# Patient Record
Sex: Female | Born: 1985 | Race: Black or African American | Hispanic: No | Smoking: Former smoker
Health system: Southern US, Community
[De-identification: ages and names within clinical notes are randomized; demographics above are authoritative.]

## PROBLEM LIST (undated history)

## (undated) ENCOUNTER — Inpatient Hospital Stay (HOSPITAL_COMMUNITY): Payer: Self-pay

## (undated) DIAGNOSIS — O1490 Unspecified pre-eclampsia, unspecified trimester: Secondary | ICD-10-CM

## (undated) DIAGNOSIS — R51 Headache: Secondary | ICD-10-CM

## (undated) DIAGNOSIS — N83209 Unspecified ovarian cyst, unspecified side: Secondary | ICD-10-CM

## (undated) DIAGNOSIS — B999 Unspecified infectious disease: Secondary | ICD-10-CM

## (undated) DIAGNOSIS — O139 Gestational [pregnancy-induced] hypertension without significant proteinuria, unspecified trimester: Secondary | ICD-10-CM

## (undated) HISTORY — PX: WISDOM TOOTH EXTRACTION: SHX21

## (undated) HISTORY — DX: Unspecified pre-eclampsia, unspecified trimester: O14.90

## (undated) HISTORY — PX: THERAPEUTIC ABORTION: SHX798

---
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
## 2006-02-28 ENCOUNTER — Emergency Department (HOSPITAL_COMMUNITY): Admission: EM | Admit: 2006-02-28 | Discharge: 2006-02-28 | Payer: Self-pay | Admitting: Family Medicine

## 2006-10-07 ENCOUNTER — Emergency Department (HOSPITAL_COMMUNITY): Admission: EM | Admit: 2006-10-07 | Discharge: 2006-10-07 | Payer: Self-pay | Admitting: Family Medicine

## 2006-11-10 ENCOUNTER — Emergency Department (HOSPITAL_COMMUNITY): Admission: EM | Admit: 2006-11-10 | Discharge: 2006-11-10 | Payer: Self-pay | Admitting: Family Medicine

## 2008-01-11 ENCOUNTER — Emergency Department (HOSPITAL_COMMUNITY): Admission: EM | Admit: 2008-01-11 | Discharge: 2008-01-11 | Payer: Self-pay | Admitting: Family Medicine

## 2008-02-05 ENCOUNTER — Emergency Department (HOSPITAL_COMMUNITY): Admission: EM | Admit: 2008-02-05 | Discharge: 2008-02-05 | Payer: Self-pay | Admitting: Family Medicine

## 2008-02-25 ENCOUNTER — Inpatient Hospital Stay (HOSPITAL_COMMUNITY): Admission: AD | Admit: 2008-02-25 | Discharge: 2008-02-25 | Payer: Self-pay | Admitting: Obstetrics & Gynecology

## 2008-06-26 ENCOUNTER — Ambulatory Visit (HOSPITAL_COMMUNITY): Admission: RE | Admit: 2008-06-26 | Discharge: 2008-06-26 | Payer: Self-pay | Admitting: Obstetrics & Gynecology

## 2008-09-17 ENCOUNTER — Inpatient Hospital Stay (HOSPITAL_COMMUNITY): Admission: AD | Admit: 2008-09-17 | Discharge: 2008-09-17 | Payer: Self-pay | Admitting: Obstetrics

## 2008-10-08 ENCOUNTER — Inpatient Hospital Stay (HOSPITAL_COMMUNITY): Admission: AD | Admit: 2008-10-08 | Discharge: 2008-10-11 | Payer: Self-pay | Admitting: Obstetrics & Gynecology

## 2009-12-24 ENCOUNTER — Emergency Department (HOSPITAL_COMMUNITY): Admission: EM | Admit: 2009-12-24 | Discharge: 2009-12-24 | Payer: Self-pay | Admitting: Emergency Medicine

## 2010-07-11 LAB — URINALYSIS, ROUTINE W REFLEX MICROSCOPIC
Bilirubin Urine: NEGATIVE
Glucose, UA: NEGATIVE mg/dL
Hgb urine dipstick: NEGATIVE
Nitrite: POSITIVE — AB
Protein, ur: NEGATIVE mg/dL
Specific Gravity, Urine: 1.014 (ref 1.005–1.030)
Urobilinogen, UA: 1 mg/dL (ref 0.0–1.0)
pH: 5 (ref 5.0–8.0)

## 2010-07-11 LAB — PREGNANCY, URINE: Preg Test, Ur: NEGATIVE

## 2010-07-11 LAB — URINE CULTURE
Colony Count: NO GROWTH
Culture  Setup Time: 201108301032
Culture: NO GROWTH

## 2010-07-11 LAB — URINE MICROSCOPIC-ADD ON

## 2010-08-04 LAB — COMPREHENSIVE METABOLIC PANEL
ALT: 13 U/L (ref 0–35)
ALT: 15 U/L (ref 0–35)
AST: 22 U/L (ref 0–37)
AST: 26 U/L (ref 0–37)
Albumin: 2.4 g/dL — ABNORMAL LOW (ref 3.5–5.2)
Albumin: 3.2 g/dL — ABNORMAL LOW (ref 3.5–5.2)
Alkaline Phosphatase: 127 U/L — ABNORMAL HIGH (ref 39–117)
Alkaline Phosphatase: 210 U/L — ABNORMAL HIGH (ref 39–117)
BUN: 3 mg/dL — ABNORMAL LOW (ref 6–23)
BUN: 12 mg/dL (ref 6–23)
CO2: 25 mEq/L (ref 19–32)
CO2: 21 mEq/L (ref 19–32)
Calcium: 7 mg/dL — ABNORMAL LOW (ref 8.4–10.5)
Calcium: 9.7 mg/dL (ref 8.4–10.5)
Chloride: 104 mEq/L (ref 96–112)
Chloride: 107 mEq/L (ref 96–112)
Creatinine, Ser: 0.54 mg/dL (ref 0.4–1.2)
Creatinine, Ser: 0.55 mg/dL (ref 0.4–1.2)
GFR calc Af Amer: >60 mL/min (ref >60)
GFR calc Af Amer: >60 mL/min (ref >60)
GFR calc non Af Amer: >60 mL/min (ref >60)
GFR calc non Af Amer: >60 mL/min (ref >60)
Glucose, Bld: 89 mg/dL (ref 70–99)
Glucose, Bld: 73 mg/dL (ref 70–99)
Potassium: 3.5 mEq/L (ref 3.5–5.1)
Potassium: 4.5 mEq/L (ref 3.5–5.1)
Sodium: 135 mEq/L (ref 135–145)
Sodium: 136 mEq/L (ref 135–145)
Total Bilirubin: 0.4 mg/dL (ref 0.3–1.2)
Total Bilirubin: 0.5 mg/dL (ref 0.3–1.2)
Total Protein: 5.3 g/dL — ABNORMAL LOW (ref 6.0–8.3)
Total Protein: 6.8 g/dL (ref 6.0–8.3)

## 2010-08-04 LAB — MAGNESIUM: Magnesium: 4.8 mg/dL — ABNORMAL HIGH (ref 1.5–2.5)

## 2010-08-04 LAB — CBC
HCT: 28.9 % — ABNORMAL LOW (ref 36.0–46.0)
HCT: 38.9 % (ref 36.0–46.0)
Hemoglobin: 10.4 g/dL — ABNORMAL LOW (ref 12.0–15.0)
Hemoglobin: 13.6 g/dL (ref 12.0–15.0)
MCHC: 35.8 g/dL (ref 30.0–36.0)
MCHC: 35.1 g/dL (ref 30.0–36.0)
MCV: 92.2 fL (ref 78.0–100.0)
MCV: 90.5 fL (ref 78.0–100.0)
Platelets: 179 10*3/uL (ref 150–400)
Platelets: 282 10*3/uL (ref 150–400)
RBC: 3.14 MIL/uL — ABNORMAL LOW (ref 3.87–5.11)
RBC: 4.3 MIL/uL (ref 3.87–5.11)
RDW: 13.4 % (ref 11.5–15.5)
RDW: 13.5 % (ref 11.5–15.5)
WBC: 9.9 10*3/uL (ref 4.0–10.5)
WBC: 13.2 10*3/uL — ABNORMAL HIGH (ref 4.0–10.5)

## 2010-08-04 LAB — LACTATE DEHYDROGENASE
LDH: 170 U/L (ref 94–250)
LDH: 239 U/L (ref 94–250)

## 2010-08-04 LAB — URIC ACID
Uric Acid, Serum: 5.3 mg/dL (ref 2.4–7.0)
Uric Acid, Serum: 5.7 mg/dL (ref 2.4–7.0)

## 2010-08-04 LAB — RPR: RPR Ser Ql: NONREACTIVE

## 2010-09-09 NOTE — H&P (Signed)
Victoria Trujillo, Victoria Trujillo               ACCOUNT NO.:  [id number]   MEDICAL RECORD NO.:  [id number]          PATIENT TYPE:  INP   LOCATION:  9372                          FACILITY:  [facility]   PHYSICIAN:  Roseanna Rainbow, M.D.DATE OF BIRTH:  20-Jan-1986   DATE OF ADMISSION:  10/08/2008  DATE OF DISCHARGE:                              HISTORY & PHYSICAL   CHIEF COMPLAINT:  The patient is a 25 year old para 0 with an estimated  date of confinement of June 12 with an intrauterine pregnancy at 40 plus  weeks with elevated blood pressures and a nonreactive nonstress test.   HISTORY OF PRESENT ILLNESS:  For the past several prenatal visits, the  patient's blood pressures have been borderline in the 130s/80s to 90s  range.  She has been spilling 1+ protein on a urine dip.  A nonstress  test in the office earlier today was minimally reactive.   ALLERGIES:  No known drug allergies.   MEDICATIONS:  Valtrex, prenatal vitamins.   OBSTETRICAL RISK FACTORS:  History of genital herpes.   PAST OBSTETRICAL HISTORY:  There is a history of a voluntary termination  of pregnancy.   PRENATAL LABORATORY:  Blood type is A+, antibody screen negative.  Chlamydia probe negative.  Urine culture and sensitivity insignificant  growth.  Quad screen negative.  One-hour GTT 92.  GBS negative on May 5.  Hepatitis B surface antigen negative.  Hematocrit 35.2, hemoglobin 11.2,  HIV nonreactive, platelets 377,000.  RPR nonreactive, rubella immune.  Ultrasound June 26, 2008 at 25 weeks 3 days.  Estimated fetal weight at  52nd percentile.  No previa.  Normal amniotic fluid index.  An  ultrasound on October 31 showed right ovarian cyst measuring 7.8 cm in  diameter and a 7-week 6-day gestation.   PAST GYNECOLOGY HISTORY:  Noncontributory.   PAST MEDICAL HISTORY:  No significant history of medical diseases.   PAST SURGICAL HISTORY:  No previous surgery.   SOCIAL HISTORY:  She works at [employer].  She is single, living  with the  significant other, formally minimal alcohol user, has no significant  smoking history.  She reports stress at work and denies illicit drug  use.   FAMILY HISTORY:  Remarkable for diabetes mellitus, hypertension.   On physical exam, vital signs blood pressure 136/89, weight 213 pounds.  Urine dip 1+ protein, trace red blood cells.  Fetal heart tracing  minimally reactive.  Mild variable decelerations.  Sterile vaginal exam,  the cervix is 1 cm dilated, 60% effaced with vertex at -2 station.   ASSESSMENT:  Intrauterine pregnancy at 40 plus weeks with possible  gestational hypertension and a nonreactive nonstress test.   PLAN:  Admission, two stage induction of labor.  We will check a PIH  panel.      Roseanna Rainbow, M.D.  Electronically Signed     LAJ/MEDQ  D:  10/08/2008  T:  10/09/2008  Job:  [id number]

## 2010-09-12 NOTE — Discharge Summary (Signed)
Victoria Trujillo, Victoria Trujillo               ACCOUNT NO.:  [id number]   MEDICAL RECORD NO.:  [id number]          PATIENT TYPE:  EMS   LOCATION:  URG                          FACILITY:  [facility]   PHYSICIAN:  Ranelle Oyster, M.D.DATE OF BIRTH:  24-Apr-1986   DATE OF ADMISSION:  11/04/2005  DATE OF DISCHARGE:  11/04/2005                                 DISCHARGE SUMMARY   DISCHARGE DIAGNOSES:  1.  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  2.  Neurogenic bowel and bladder.  3.  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  4.  Emphysema.  5.  Hypertension.   HISTORY OF PRESENT ILLNESS:  [name] is a 25 year old female with a  history of low back pain who comes here with progressive weakness and pain  with progressive quadriparesis and bowel and bladder dysfunction since 2002.  EMG nerve conduction studies showed evidence of peripheral sensory and motor  peripheral neuropathy.  Patient with suspicion of having CIDP and was  treated IV Ig x2 on an outpatient basis.  On Sep 05, 2005 she was admitted  with acute shortness of breath with cough and congestion secondary to acute  bronchitis and atelectasis.  She was evaluated by Dr. Sandria Manly and Dr. Delford Field,  treated with nebulizer and C-PAP, as well as IV Medrol and IV Rocephin, as  the patient with a bronchitis flare, as well as continued hypoxia.  Pulmonary has been following the patient.  The patient with thoracic  restrictive disease secondary to quadriparesis and motor weakness.  Nebulizer's and O2 continued currently.  The patient did continue with  neuropathy with quadriparesis.  No improvement with steroids and IV Ig.  Underwent biopsy and right gastrocnemia muscle biopsy Sep 17, 2005 by Dr.  Newell Coral for workup.  Other workup for malignancy done include CT of chest  and pelvis.  It was negative.  The patient was noted to have elevated D-  dimer.  Bilateral lower extremity Dopplers were negative for  DVT and CT  angio chest negative for PE.  Central lobular emphysematous changes was seen  in bilateral upper lobe.  The patient continues to be in bed with  quadriparesis and decrease in function mobility and ability for self care.  We have consulted for the therapy.   PAST MEDICAL HISTORY:  Significant for:  1.  Hypertension.  2.  GI disease.  3.  GU disease.  4.  Lumbar laminectomy in 2006.  5.  History of multiple abdominal surgeries.  6.  Obesity.  7.  Emphysema.  8.  Progressive neuropathy.   ALLERGIES:  CODEINE AND ASPIRIN.   FAMILY HISTORY:  Positive for diabetes, coronary artery disease and MS.   SOCIAL HISTORY:  The patient is married, lives with husband and mother.  Does not use alcohol.  Smokes a half pack per day.  Has been bed bound since  January 2007.  Uses electric wheelchair.  Needed assistance for ADLs prior  to admission.   HOSPITAL COURSE:  Victoria Trujillo was admitted to rehab on  09/17/2005.  Last admission the patient was initially placed on Neurontin.  She has neuropathy and uses p.r.n.  oxycodone.  Nebulizer's were continued  during the stay secondary to issues with hypoxia.  She required O2 two  liters per nasal cannula, as well as chest PT initially to assist with  mobilizing secretions.  She was encouraged to continue pulmonary toilet with  use of nebulizers to help with the pulmonary status.  Chest x-ray on Sep 21, 2005 showed persistent bilateral pleural effusion and bibasilar atelectasis,  no edema.  Followup chest x-ray on October 01, 2005 showed a little change in  bibasilar linear atelectasis.  The patient has had issues regarding  dysphagia and esophagram done on Sep 24, 2005 showed normal esophageal  motility.  The patient also has issues with constipation.  A bowel program  was set with the use Dulcolax tabs at lunchtime with p.r.n. use of Dulcolax  suppository.  The patient's mobility improved.  She was placed on bedside  commode,  which has helped with evacuation.  Currently patient to continue  regimen of Dulcolax tablets at lunchtime with Dulcolax suppository if no BMs  in 2-3 days.  The patient has continued with hypoxia and CPAP was used to  her chest to help sleep apnea secondary to thoracic restrictive disease.  Issues regarding a need for CPAP was addressed with documentation of O2 sats  with pulse ox through the night done on October 16, 2005.  This revealed 98  times desaturation with O2 sats ranging from 87% to 91%.  ABG done on the  a.m. of October 16, 2005 off CPAP revealed pH 7.38, pO2 49.4, pCO2 51.3, bicarb  29.8.  The patient was setup for home use for CPAP to continue past  discharge secondary to her thoracic restrictive airway disease.  She is also  to use O2 during the day secondary to continued hypoxia.   LABORATORY DATA:  Labs done prior to discharge includes CBC revealing  hemoglobin 12.5, hematocrit 37.1, white count 8.8, platelets 315.  Electrolytes revealed sodium 136, potassium 3.7, chloride 104, CO2 28, BUN  11, creatinine 0.4, and glucose of 112.  She was noted to have abnormal LFTs  at admission with AST 75, ALT 165, alkaline phosphatase 89, total bilirubin  0.7.  These were noted to be much improved by time of discharge.  AST 33,  ALT 46, alkaline phosphatase 68, total bilirubin 0.6.  CK as of Sep 24, 2005  was at 108.  TSH normal at 5.257.  Other workup included check of HIV, which  was nonreactive.  Porphobilinogen quantitative of urine was less than 3.  Creatinine urine 29 mg/dL with total volume of 3664.  Evaluation of urine  for heavy metal was negative for arsenic and lead.  Other test including  ganglioside, monosialic acid antibody results currently pending.   Dr. Sandria Manly has been following on patient.  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  She was started on IV Ig and was treated with 400 mg/kg x2 days on September 25, 2005 and September 26, 2005 and two weeks on October 09, 2005 and October 10, 2005.  Patient to continue on IV Ig q.2 weeks past discharge.  The patient  has had issues with pain and was started on OxyContin for pain control  secondary to continued problems with neuropathy despite high doses of  Neurontin.  She was started on Lyrica and Neurontin was tapered off.  Blood  pressures initially controlled, but noted to slowly trend upward.  Cozaar  dose was adjusted  and atenolol was added with improvement in BP control.  The patient continues with Foley secondary to neurogenic bladder with  problems with incontinence.  She has had issues regarding left shoulder pain  that was injected with steroids.  Rib pain was treated with use of lidocaine  patches.   The patient continued to have impairment in mobility with slow steady  progress.  She continued with motor strength and the right arm at 2-3/5,  left at 2/5, approximately 5/5 distally on right, 4/5 distally on left.  Some right core plantar dorsiflexion foot and some increased movement left  lower extremity.  At time of discharge the patient was able to perform  dressing in bed with head of bed raised.  Transfers were improving stronger  on the right side being at moderate assist level, but continued to be  inconsistent.  Sitting balance - patient had excellent sitting balance when  unsupported.  Leg lifts were ordered to help assist with lower extremities;  however mobility of these was limited secondary to bilateral upper extremity  weakness.  She was able to perform sliding bed transfer mod to max assist  plus one.  The family to continue to provide 24 hours assistance past  discharge.  Family aid was consulted regarding transfers and care to be  provided past discharge.  Patient will continue the progressive home health  PT/OT by [facility].  Home health RN arranged for follow up regarding  bowel and bladder issues.  On October 27, 2005 patient is discharged to home.   DISCHARGE MEDICATIONS:  1.  Cozaar 100 mg  per day.  2.  Midrin 2 p.o. per day p.r.n. migraines  3.  Protonix 40 mg a day.  4.  __________  0.5 mg b.i.d.  5.  Trazodone 50 mg q.h.s.  6.  Baclofen 10 mg t.i.d.  7.  Lexapro 10 mg q.h.s.  8.  Elavil 30 mg q.h.s.  9.  __________ mg q.i.d.  10. Claritin 10 mg a day.  11. OxyContin CR 30 mg q.12 h.  12. OxyIR 5 mg 1-2 q.4-6 h. p.r.n. pain.  13. Dulcolax tabs 2 at lunch and Dulcolax suppository every other day p.r.n.  14. Lidocaine patch to left chest wall.  On a.m., of p.m.  15. Flexeril 10 mg half q.8 h. p.r.n. spasm.  16. Atenolol 50 mg a day.  17. Xopenex 1.25 mg nebulizer 8 a.m., 2 p.m. and bedtime.  18. Pulmicort 0.25 mg nebulizer 8 a.m., 2 p.m. and bedtime.  19. Chromium sodium nebulizer 8 a.m., 2 p.m. and bedtime.   ACTIVITY:  Up with assistance and with ADLs.   DIET:  Regular.   SPECIAL INSTRUCTIONS:  Continue using CPAP at bedtime or whenever asleep. Use oxygen at all times.  No smoking, no alcohol.  Home CPAP, O2 titrate it  x2 weeks with mask.  Follow up sleep study on outpatient basis.  Patient to  follow up with Dr. Riley Kill in one month.  Follow up with Dr. Sandria Manly in three  weeks.  Follow up with Dr. Linton Rump, Dr. Delford Field for routine checks.      Greg Cutter, P.A.      Ranelle Oyster, M.D.  Electronically Signed    PP/MEDQ  D:  11/05/2005  T:  11/06/2005  Job:  [id number]   cc:   Genene Churn. Love, M.D.  Fax: [phone]   Wynne Dust. Linton Rump, Ph.D.  Fax: [phone]   Shan Levans, M.D. [facility]  [address]  [place]  [place] [address]

## 2011-01-26 LAB — POCT URINALYSIS DIP (DEVICE)
Bilirubin Urine: NEGATIVE
Glucose, UA: NEGATIVE
Hgb urine dipstick: NEGATIVE
Ketones, ur: NEGATIVE
Nitrite: NEGATIVE
Operator id: 282151
Protein, ur: NEGATIVE
Specific Gravity, Urine: 1.015
Urobilinogen, UA: 0.2
pH: 8.5 — ABNORMAL HIGH

## 2011-01-26 LAB — URINE CULTURE: Colony Count: 50000

## 2011-01-26 LAB — POCT RAPID STREP A: Streptococcus, Group A Screen (Direct): NEGATIVE

## 2011-01-26 LAB — POCT PREGNANCY, URINE: Preg Test, Ur: POSITIVE

## 2011-01-27 LAB — GC/CHLAMYDIA PROBE AMP, GENITAL
Chlamydia, DNA Probe: NEGATIVE
GC Probe Amp, Genital: NEGATIVE

## 2011-01-27 LAB — URINE MICROSCOPIC-ADD ON: RBC / HPF: NONE SEEN

## 2011-01-27 LAB — URINALYSIS, ROUTINE W REFLEX MICROSCOPIC
Bilirubin Urine: NEGATIVE
Glucose, UA: NEGATIVE
Ketones, ur: 15 — AB
Nitrite: NEGATIVE
Protein, ur: 30 — AB
Specific Gravity, Urine: >1.03 — ABNORMAL HIGH
Urobilinogen, UA: 0.2
pH: 6

## 2011-01-27 LAB — CBC
Hemoglobin: 11.8 — ABNORMAL LOW
RBC: 3.96
WBC: 10

## 2011-01-27 LAB — URINE CULTURE: Colony Count: >=100000

## 2011-01-27 LAB — WET PREP, GENITAL: Clue Cells Wet Prep HPF POC: NONE SEEN

## 2011-01-27 LAB — HCG, QUANTITATIVE, PREGNANCY: hCG, Beta Chain, Quant, S: 96826 — ABNORMAL HIGH

## 2011-02-09 LAB — POCT URINALYSIS DIP (DEVICE)
Glucose, UA: NEGATIVE
Nitrite: NEGATIVE
Operator id: 247071
Protein, ur: 30 — AB
Specific Gravity, Urine: >=1.03
Urobilinogen, UA: 0.2
pH: 6

## 2011-02-09 LAB — WET PREP, GENITAL
Trich, Wet Prep: NONE SEEN
Yeast Wet Prep HPF POC: NONE SEEN

## 2011-02-09 LAB — GC/CHLAMYDIA PROBE AMP, GENITAL
Chlamydia, DNA Probe: POSITIVE — AB
GC Probe Amp, Genital: POSITIVE — AB

## 2011-02-09 LAB — POCT PREGNANCY, URINE
Operator id: 247071
Preg Test, Ur: NEGATIVE

## 2011-02-12 LAB — POCT RAPID STREP A: Streptococcus, Group A Screen (Direct): NEGATIVE

## 2012-10-19 ENCOUNTER — Inpatient Hospital Stay (HOSPITAL_COMMUNITY)
Admission: AD | Admit: 2012-10-19 | Discharge: 2012-10-19 | Disposition: A | Discharge status: Home / Self Care | Payer: Medicaid Other | Source: Ambulatory Visit | Attending: Obstetrics & Gynecology | Admitting: Obstetrics & Gynecology

## 2012-10-19 ENCOUNTER — Encounter (HOSPITAL_COMMUNITY): Payer: Self-pay

## 2012-10-19 DIAGNOSIS — Z3201 Encounter for pregnancy test, result positive: Secondary | ICD-10-CM | POA: Insufficient documentation

## 2012-10-19 HISTORY — DX: Unspecified ovarian cyst, unspecified side: N83.209

## 2012-10-19 HISTORY — DX: Unspecified infectious disease: B99.9

## 2012-10-19 LAB — POCT PREGNANCY, URINE: Preg Test, Ur: POSITIVE — AB

## 2012-10-19 NOTE — MAU Note (Signed)
Pos home test on Mon, period in May was normal as expected.  No problems just need confirmation

## 2012-10-19 NOTE — MAU Note (Signed)
Patient states she had a positive home pregnancy test yesterday. Denies any problems, pain, bleeding or discharge.

## 2012-11-02 ENCOUNTER — Encounter: Payer: Self-pay | Admitting: Advanced Practice Midwife

## 2012-11-02 ENCOUNTER — Ambulatory Visit (INDEPENDENT_AMBULATORY_CARE_PROVIDER_SITE_OTHER): Payer: Medicaid Other | Admitting: Advanced Practice Midwife

## 2012-11-02 VITALS — BP 122/75 | Ht 65.0 in | Wt 205.9 lb

## 2012-11-02 DIAGNOSIS — Z3480 Encounter for supervision of other normal pregnancy, unspecified trimester: Secondary | ICD-10-CM

## 2012-11-02 DIAGNOSIS — Z3491 Encounter for supervision of normal pregnancy, unspecified, first trimester: Secondary | ICD-10-CM

## 2012-11-02 LAB — POCT URINALYSIS DIP (DEVICE)
Protein, ur: NEGATIVE mg/dL
Specific Gravity, Urine: 1.015 (ref 1.005–1.030)
Urobilinogen, UA: 0.2 mg/dL (ref 0.0–1.0)

## 2012-11-02 NOTE — Progress Notes (Signed)
   Subjective:    Victoria Trujillo is a [id number] [redacted]w[redacted]d being seen today for her first obstetrical visit.  Her obstetrical history is significant for NSVDx1 with IOL at term for [facility] and SAB x1. Patient does intend to breast feed. Pregnancy history fully reviewed.  Patient reports fatigue and nausea.  Filed Vitals:   11/02/12 1013 11/02/12 1014  BP: 122/75   Height:  5\' 5"  (1.651 m)  Weight: 93.396 kg (205 lb 14.4 oz)     HISTORY: OB History   Grav Para Term Preterm Abortions TAB SAB Ect Mult Living   3 1 1  0 1 1 0 0 0 1     # Outc Date GA Lbr Len/2nd Wgt Sex Del Anes PTL Lv   1 TRM 6/10    F SVD EPI No Yes   Comments: induced due to BP   2 TAB            3 CUR              Past Medical History  Diagnosis Date  . Infection     UTI  . Ovarian cyst    Past Surgical History  Procedure Laterality Date  . Therapeutic abortion     Family History  Problem Relation Age of Onset  . Hypertension Mother   . Diabetes Father   . Hypertension Father   . Heart disease Father   . Stroke Father   . Hypertension Maternal Grandmother   . Cancer Maternal Grandmother   . Kidney disease Maternal Grandfather      Exam    Uterus:     Pelvic Exam:    Perineum: Normal Perineum   Vulva: normal   Vagina:  normal mucosa, normal discharge   pH:    Cervix: no cervical motion tenderness   Adnexa: normal adnexa and no mass, fullness, tenderness   Bony Pelvis: average  System: Breast:  normal appearance, no masses or tenderness   Skin: normal coloration and turgor, no rashes    Neurologic: oriented, normal, negative, normal mood, gait normal; reflexes normal and symmetric   Extremities: normal strength, tone, and muscle mass   HEENT neck supple with midline trachea and thyroid without masses   Mouth/Teeth mucous membranes moist, pharynx normal without lesions and dental hygiene good   Neck supple and no masses   Cardiovascular: regular rate and rhythm   Respiratory:  appears well,  vitals normal, no respiratory distress, acyanotic, normal RR, ear and throat exam is normal, neck free of mass or lymphadenopathy, chest clear, no wheezing, crepitations, rhonchi, normal symmetric air entry   Abdomen: soft, non-tender; bowel sounds normal; no masses,  no organomegaly   Urinary: urethral meatus normal      Assessment:    Pregnancy: [id number] Patient Active Problem List   Diagnosis Date Noted  . Supervision of normal subsequent pregnancy 11/02/2012        Plan:     Initial labs drawn. Prenatal vitamins. Problem list reviewed and updated.  Ultrasound discussed; fetal survey: requested.  Follow up in 4 weeks. 50% of 30 min visit spent on counseling and coordination of care.     LEFTWICH-KIRBY, Vi Biddinger 11/02/2012

## 2012-11-02 NOTE — Progress Notes (Signed)
Pulse: 81

## 2012-11-03 LAB — OBSTETRIC PANEL
Antibody Screen: NEGATIVE
Basophils Absolute: 0 10*3/uL (ref 0.0–0.1)
Basophils Relative: 0 % (ref 0–1)
Eosinophils Relative: 0 % (ref 0–5)
HCT: 39.8 % (ref 36.0–46.0)
MCHC: 34.4 g/dL (ref 30.0–36.0)
Monocytes Absolute: 0.6 10*3/uL (ref 0.1–1.0)
Neutro Abs: 5.4 10*3/uL (ref 1.7–7.7)
RDW: 14.2 % (ref 11.5–15.5)

## 2012-11-03 LAB — GC/CHLAMYDIA PROBE AMP
CT Probe RNA: NEGATIVE
GC Probe RNA: NEGATIVE

## 2012-11-03 LAB — GLUCOSE TOLERANCE, 1 HOUR (50G) W/O FASTING: Glucose, 1 Hour GTT: 102 mg/dL (ref 70–140)

## 2012-11-03 LAB — HIV ANTIBODY (ROUTINE TESTING W REFLEX): HIV: NONREACTIVE

## 2012-11-04 LAB — HEMOGLOBINOPATHY EVALUATION
Hgb A2 Quant: 2.8 % (ref 2.2–3.2)
Hgb A: 97.2 % (ref 96.8–97.8)
Hgb F Quant: 0 % (ref 0.0–2.0)

## 2012-11-30 ENCOUNTER — Ambulatory Visit (INDEPENDENT_AMBULATORY_CARE_PROVIDER_SITE_OTHER): Payer: Medicaid Other | Admitting: Advanced Practice Midwife

## 2012-11-30 VITALS — BP 115/73 | Wt 211.0 lb

## 2012-11-30 DIAGNOSIS — IMO0002 Reserved for concepts with insufficient information to code with codable children: Secondary | ICD-10-CM

## 2012-11-30 DIAGNOSIS — Z3481 Encounter for supervision of other normal pregnancy, first trimester: Secondary | ICD-10-CM

## 2012-11-30 DIAGNOSIS — Z3490 Encounter for supervision of normal pregnancy, unspecified, unspecified trimester: Secondary | ICD-10-CM

## 2012-11-30 LAB — POCT URINALYSIS DIP (DEVICE)
Bilirubin Urine: NEGATIVE
Ketones, ur: NEGATIVE mg/dL

## 2012-11-30 NOTE — Progress Notes (Signed)
Pulse: 77

## 2012-11-30 NOTE — Progress Notes (Signed)
Unable to hear FHTs but movement heard. Will schedule first trimester screen with MFM for this week. Lab results reviewed

## 2012-11-30 NOTE — Patient Instructions (Signed)
Pregnancy - First Trimester  During sexual intercourse, millions of sperm go into the vagina. Only 1 sperm will penetrate and fertilize the female egg while it is in the Fallopian tube. One week later, the fertilized egg implants into the wall of the uterus. An embryo begins to develop into a baby. At 6 to 8 weeks, the eyes and face are formed and the heartbeat can be seen on ultrasound. At the end of 12 weeks (first trimester), all the baby's organs are formed. Now that you are pregnant, you will want to do everything you can to have a healthy baby. Two of the most important things are to get good prenatal care and follow your caregiver's instructions. Prenatal care is all the medical care you receive before the baby's birth. It is given to prevent, find, and treat problems during the pregnancy and childbirth.  PRENATAL EXAMS  · During prenatal visits, your weight, blood pressure, and urine are checked. This is done to make sure you are healthy and progressing normally during the pregnancy.  · A pregnant woman should gain 25 to 35 pounds during the pregnancy. However, if you are overweight or underweight, your caregiver will advise you regarding your weight.  · Your caregiver will ask and answer questions for you.  · Blood work, cervical cultures, other necessary tests, and a Pap test are done during your prenatal exams. These tests are done to check on your health and the probable health of your baby. Tests are strongly recommended and done for HIV with your permission. This is the virus that causes AIDS. These tests are done because medicines can be given to help prevent your baby from being born with this infection should you have been infected without knowing it. Blood work is also used to find out your blood type, previous infections, and follow your blood levels (hemoglobin).  · Low hemoglobin (anemia) is common during pregnancy. Iron and vitamins are given to help prevent this. Later in the pregnancy, blood  tests for diabetes will be done along with any other tests if any problems develop.  · You may need other tests to make sure you and the baby are doing well.  CHANGES DURING THE FIRST TRIMESTER   Your body goes through many changes during pregnancy. They vary from person to person. Talk to your caregiver about changes you notice and are concerned about. Changes can include:  · Your menstrual period stops.  · The egg and sperm carry the genes that determine what you look like. Genes from you and your partner are forming a baby. The female genes determine whether the baby is a boy or a girl.  · Your body increases in girth and you may feel bloated.  · Feeling sick to your stomach (nauseous) and throwing up (vomiting). If the vomiting is uncontrollable, call your caregiver.  · Your breasts will begin to enlarge and become tender.  · Your nipples may stick out more and become darker.  · The need to urinate more. Painful urination may mean you have a bladder infection.  · Tiring easily.  · Loss of appetite.  · Cravings for certain kinds of food.  · At first, you may gain or lose a couple of pounds.  · You may have changes in your emotions from day to day (excited to be pregnant or concerned something may go wrong with the pregnancy and baby).  · You may have more vivid and strange dreams.  HOME CARE INSTRUCTIONS   ·   It is very important to avoid all smoking, alcohol and non-prescribed drugs during your pregnancy. These affect the formation and growth of the baby. Avoid chemicals while pregnant to ensure the delivery of a healthy infant.  · Start your prenatal visits by the 12th week of pregnancy. They are usually scheduled monthly at first, then more often in the last 2 months before delivery. Keep your caregiver's appointments. Follow your caregiver's instructions regarding medicine use, blood and lab tests, exercise, and diet.  · During pregnancy, you are providing food for you and your baby. Eat regular, well-balanced  meals. Choose foods such as meat, fish, milk and other low fat dairy products, vegetables, fruits, and whole-grain breads and cereals. Your caregiver will tell you of the ideal weight gain.  · You can help morning sickness by keeping soda crackers at the bedside. Eat a couple before arising in the morning. You may want to use the crackers without salt on them.  · Eating 4 to 5 small meals rather than 3 large meals a day also may help the nausea and vomiting.  · Drinking liquids between meals instead of during meals also seems to help nausea and vomiting.  · A physical sexual relationship may be continued throughout pregnancy if there are no other problems. Problems may be early (premature) leaking of amniotic fluid from the membranes, vaginal bleeding, or belly (abdominal) pain.  · Exercise regularly if there are no restrictions. Check with your caregiver or physical therapist if you are unsure of the safety of some of your exercises. Greater weight gain will occur in the last 2 trimesters of pregnancy. Exercising will help:  · Control your weight.  · Keep you in shape.  · Prepare you for labor and delivery.  · Help you lose your pregnancy weight after you deliver your baby.  · Wear a good support or jogging bra for breast tenderness during pregnancy. This may help if worn during sleep too.  · Ask when prenatal classes are available. Begin classes when they are offered.  · Do not use hot tubs, steam rooms, or saunas.  · Wear your seat belt when driving. This protects you and your baby if you are in an accident.  · Avoid raw meat, uncooked cheese, cat litter boxes, and soil used by cats throughout the pregnancy. These carry germs that can cause birth defects in the baby.  · The first trimester is a good time to visit your dentist for your dental health. Getting your teeth cleaned is okay. Use a softer toothbrush and brush gently during pregnancy.  · Ask for help if you have financial, counseling, or nutritional needs  during pregnancy. Your caregiver will be able to offer counseling for these needs as well as refer you for other special needs.  · Do not take any medicines or herbs unless told by your caregiver.  · Inform your caregiver if there is any mental or physical domestic violence.  · Make a list of emergency phone numbers of family, friends, hospital, and police and fire departments.  · Write down your questions. Take them to your prenatal visit.  · Do not douche.  · Do not cross your legs.  · If you have to stand for long periods of time, rotate you feet or take small steps in a circle.  · You may have more vaginal secretions that may require a sanitary pad. Do not use tampons or scented sanitary pads.  MEDICINES AND DRUG USE IN PREGNANCY  ·   Take prenatal vitamins as directed. The vitamin should contain 1 milligram of folic acid. Keep all vitamins out of reach of children. Only a couple vitamins or tablets containing iron may be fatal to a baby or young child when ingested.  · Avoid use of all medicines, including herbs, over-the-counter medicines, not prescribed or suggested by your caregiver. Only take over-the-counter or prescription medicines for pain, discomfort, or fever as directed by your caregiver. Do not use aspirin, ibuprofen, or naproxen unless directed by your caregiver.  · Let your caregiver also know about herbs you may be using.  · Alcohol is related to a number of birth defects. This includes fetal alcohol syndrome. All alcohol, in any form, should be avoided completely. Smoking will cause low birth rate and premature babies.  · Street or illegal drugs are very harmful to the baby. They are absolutely forbidden. A baby born to an addicted mother will be addicted at birth. The baby will go through the same withdrawal an adult does.  · Let your caregiver know about any medicines that you have to take and for what reason you take them.  SEEK MEDICAL CARE IF:   You have any concerns or worries during your  pregnancy. It is better to call with your questions if you feel they cannot wait, rather than worry about them.  SEEK IMMEDIATE MEDICAL CARE IF:   · An unexplained oral temperature above 102° F (38.9° C) develops, or as your caregiver suggests.  · You have leaking of fluid from the vagina (birth canal). If leaking membranes are suspected, take your temperature and inform your caregiver of this when you call.  · There is vaginal spotting or bleeding. Notify your caregiver of the amount and how many pads are used.  · You develop a bad smelling vaginal discharge with a change in the color.  · You continue to feel sick to your stomach (nauseated) and have no relief from remedies suggested. You vomit blood or coffee ground-like materials.  · You lose more than 2 pounds of weight in 1 week.  · You gain more than 2 pounds of weight in 1 week and you notice swelling of your face, hands, feet, or legs.  · You gain 5 pounds or more in 1 week (even if you do not have swelling of your hands, face, legs, or feet).  · You get exposed to German measles and have never had them.  · You are exposed to fifth disease or chickenpox.  · You develop belly (abdominal) pain. Round ligament discomfort is a common non-cancerous (benign) cause of abdominal pain in pregnancy. Your caregiver still must evaluate this.  · You develop headache, fever, diarrhea, pain with urination, or shortness of breath.  · You fall or are in a car accident or have any kind of trauma.  · There is mental or physical violence in your home.  Document Released: 04/07/2001 Document Revised: 01/06/2012 Document Reviewed: 10/09/2008  [employer]® Patient Information ©2014 [employer].

## 2012-12-02 ENCOUNTER — Encounter: Payer: Self-pay | Admitting: *Deleted

## 2012-12-02 DIAGNOSIS — IMO0002 Reserved for concepts with insufficient information to code with codable children: Secondary | ICD-10-CM | POA: Insufficient documentation

## 2012-12-09 ENCOUNTER — Other Ambulatory Visit: Payer: Self-pay | Admitting: Family Medicine

## 2012-12-09 DIAGNOSIS — Z3682 Encounter for antenatal screening for nuchal translucency: Secondary | ICD-10-CM

## 2012-12-13 ENCOUNTER — Encounter: Payer: Self-pay | Admitting: Family Medicine

## 2012-12-13 ENCOUNTER — Ambulatory Visit (HOSPITAL_COMMUNITY)
Admission: RE | Admit: 2012-12-13 | Discharge: 2012-12-13 | Disposition: A | Discharge status: Home / Self Care | Payer: Medicaid Other | Source: Ambulatory Visit | Attending: Family Medicine | Admitting: Family Medicine

## 2012-12-13 ENCOUNTER — Other Ambulatory Visit: Payer: Self-pay

## 2012-12-13 VITALS — BP 116/68 | HR 90 | Wt 211.2 lb

## 2012-12-13 DIAGNOSIS — Z3682 Encounter for antenatal screening for nuchal translucency: Secondary | ICD-10-CM

## 2012-12-13 DIAGNOSIS — O351XX0 Maternal care for (suspected) chromosomal abnormality in fetus, not applicable or unspecified: Secondary | ICD-10-CM | POA: Insufficient documentation

## 2012-12-13 DIAGNOSIS — Z3689 Encounter for other specified antenatal screening: Secondary | ICD-10-CM | POA: Insufficient documentation

## 2012-12-13 DIAGNOSIS — Z1389 Encounter for screening for other disorder: Secondary | ICD-10-CM

## 2012-12-13 DIAGNOSIS — O3510X0 Maternal care for (suspected) chromosomal abnormality in fetus, unspecified, not applicable or unspecified: Secondary | ICD-10-CM | POA: Insufficient documentation

## 2012-12-13 NOTE — Progress Notes (Signed)
Victoria Trujillo  was seen today for an ultrasound appointment.  See full report in [facility].  Impression: Single IUP at 12 3/7 weeks Normal NT (1.1 mm).  Unable to visualize nasal bone due to fetal position ? complex right adnexal mass (possible dermoid) First trimester aneuploidy screen performed as noted above.    Recommendations: Please do not draw triple/quad screen, though patient should be offered MSAFP for neural tube defect screening.  Recommend follow up in 6 weeks for anatomy - will reevalaute right adnexa at that time  Alpha Gula, MD

## 2012-12-23 ENCOUNTER — Encounter: Payer: Self-pay | Admitting: *Deleted

## 2012-12-23 DIAGNOSIS — Z3480 Encounter for supervision of other normal pregnancy, unspecified trimester: Secondary | ICD-10-CM

## 2012-12-27 ENCOUNTER — Encounter (HOSPITAL_COMMUNITY): Payer: Self-pay | Admitting: *Deleted

## 2012-12-27 ENCOUNTER — Inpatient Hospital Stay (HOSPITAL_COMMUNITY)
Admission: AD | Admit: 2012-12-27 | Discharge: 2012-12-27 | Disposition: A | Discharge status: Home / Self Care | Payer: Medicaid Other | Source: Ambulatory Visit | Attending: Obstetrics & Gynecology | Admitting: Obstetrics & Gynecology

## 2012-12-27 DIAGNOSIS — O99891 Other specified diseases and conditions complicating pregnancy: Secondary | ICD-10-CM | POA: Insufficient documentation

## 2012-12-27 DIAGNOSIS — R51 Headache: Secondary | ICD-10-CM | POA: Insufficient documentation

## 2012-12-27 DIAGNOSIS — O26892 Other specified pregnancy related conditions, second trimester: Secondary | ICD-10-CM

## 2012-12-27 DIAGNOSIS — O9989 Other specified diseases and conditions complicating pregnancy, childbirth and the puerperium: Secondary | ICD-10-CM

## 2012-12-27 DIAGNOSIS — H538 Other visual disturbances: Secondary | ICD-10-CM | POA: Insufficient documentation

## 2012-12-27 HISTORY — DX: Headache: R51

## 2012-12-27 MED ORDER — BUTALBITAL-APAP-CAFFEINE 50-325-40 MG PO TABS
1.0000 | ORAL_TABLET | Freq: Once | ORAL | Status: AC
  Administered 2012-12-27: 1 via ORAL
  Filled 2012-12-27: qty 1

## 2012-12-27 MED ORDER — BUTALBITAL-APAP-CAFFEINE 50-500-40 MG PO TABS: 1.0000 | ORAL_TABLET | Freq: Four times a day (QID) | ORAL | Status: DC | PRN

## 2012-12-27 MED ORDER — CYCLOBENZAPRINE HCL 10 MG PO TABS
10.0000 mg | ORAL_TABLET | Freq: Once | ORAL | Status: AC
  Administered 2012-12-27: 10 mg via ORAL
  Filled 2012-12-27 (×2): qty 1

## 2012-12-27 NOTE — MAU Note (Signed)
H/A x weekend, tylenol not helping, last dose  3am, has headaches just not this bad and lasting this long (since Friday) BP 118/67

## 2012-12-27 NOTE — MAU Provider Note (Signed)
Attestation of Attending Supervision of Advanced Practitioner (CNM/NP): Evaluation and management procedures were performed by the Advanced Practitioner under my supervision and collaboration.  I have reviewed the Advanced Practitioner's note and chart, and I agree with the management and plan.  HARRAWAY-SMITH, Oluwateniola Leitch 4:15 PM

## 2012-12-27 NOTE — MAU Note (Signed)
Patient c/o of headache with pain 7/10. Patient states she had blurry vision yesterday but none today. Patient denies vaginal bleeding and lof.

## 2012-12-27 NOTE — MAU Provider Note (Signed)
  History     CSN: [id number]  Arrival date and time: 12/27/12 1103   First Provider Initiated Contact with Patient 12/27/12 1243      Chief Complaint  Patient presents with  . Headache   HPI  Pt is a G3P1011 at [redacted]w[redacted]d here with report of headache that worsened yesterday.  Pain is described as throbbing.  Denies photophobia.  Recalls having similar symptoms with prior pregnancy.  Denies abdominal pain and vaginal bleeding.    Past Medical History  Diagnosis Date  . Infection     UTI  . Ovarian cyst   . JYNWGNFA(213.0)     Past Surgical History  Procedure Laterality Date  . Therapeutic abortion    . Wisdom tooth extraction      Family History  Problem Relation Age of Onset  . Hypertension Mother   . Diabetes Father   . Hypertension Father   . Heart disease Father   . Stroke Father   . Hypertension Maternal Grandmother   . Cancer Maternal Grandmother   . Kidney disease Maternal Grandfather     History  Substance Use Topics  . Smoking status: Former Games developer  . Smokeless tobacco: Never Used     Comment: not daily  . Alcohol Use: No    Allergies: No Known Allergies  Prescriptions prior to admission  Medication Sig Dispense Refill  . Prenatal Vitamins (DIS) TABS Take 1 tablet by mouth daily.        Review of Systems  Gastrointestinal: Negative for nausea and vomiting.  Neurological: Positive for headaches.  All other systems reviewed and are negative.   Physical Exam   Blood pressure 118/67, pulse 84, temperature 98.3 F (36.8 C), resp. rate 18, height 5\' 5"  (1.651 m), weight 94.802 kg (209 lb), last menstrual period 09/17/2012, SpO2 100.00%.  Physical Exam  Constitutional: She is oriented to person, place, and time. She appears well-developed and well-nourished. No distress.  HENT:  Head: Normocephalic.  Eyes: EOM are normal. Pupils are equal, round, and reactive to light.  Neck: Normal range of motion. Neck supple.  Cardiovascular: Normal rate,  regular rhythm and normal heart sounds.   Respiratory: Effort normal and breath sounds normal.  GI: Soft. There is no tenderness.  Neurological: She is alert and oriented to person, place, and time.  Skin: Skin is warm and dry.    MAU Course  Procedures  1320 Pt reports little relief with flexeril > order for fioricet 1415 Pt reports relief with fioricet  No results found for this or any previous visit (from the past 24 hour(s)).  Assessment and Plan  Headache in Pregnancy  Plan: Discharge to home RX Fioricet Next OB appt tomorrow  [facility] 12/27/2012, 12:44 PM

## 2012-12-28 ENCOUNTER — Ambulatory Visit (INDEPENDENT_AMBULATORY_CARE_PROVIDER_SITE_OTHER): Payer: Medicaid Other | Admitting: Family Medicine

## 2012-12-28 VITALS — BP 119/73 | Temp 98.5°F | Wt 210.7 lb

## 2012-12-28 DIAGNOSIS — Z3481 Encounter for supervision of other normal pregnancy, first trimester: Secondary | ICD-10-CM

## 2012-12-28 DIAGNOSIS — IMO0002 Reserved for concepts with insufficient information to code with codable children: Secondary | ICD-10-CM

## 2012-12-28 LAB — POCT URINALYSIS DIP (DEVICE)
Glucose, UA: NEGATIVE mg/dL
Nitrite: NEGATIVE
Protein, ur: NEGATIVE mg/dL
Urobilinogen, UA: 0.2 mg/dL (ref 0.0–1.0)

## 2012-12-28 MED ORDER — BUTALBITAL-ASPIRIN-CAFFEINE 50-325-40 MG PO TABS: 1.0000 | ORAL_TABLET | Freq: Two times a day (BID) | ORAL | Status: DC | PRN

## 2012-12-28 NOTE — Patient Instructions (Signed)
Pregnancy - Second Trimester The second trimester of pregnancy (3 to 6 months) is a period of rapid growth for you and your baby. At the end of the sixth month, your baby is about 9 inches long and weighs 1 1/2 pounds. You will begin to feel the baby move between 18 and 20 weeks of the pregnancy. This is called quickening. Weight gain is faster. A clear fluid (colostrum) may leak out of your breasts. You may feel small contractions of the womb (uterus). This is known as false labor or Braxton-Hicks contractions. This is like a practice for labor when the baby is ready to be born. Usually, the problems with morning sickness have usually passed by the end of your first trimester. Some women develop small dark blotches (called cholasma, mask of pregnancy) on their face that usually goes away after the baby is born. Exposure to the sun makes the blotches worse. Acne may also develop in some pregnant women and pregnant women who have acne, may find that it goes away. PRENATAL EXAMS  Blood work may continue to be done during prenatal exams. These tests are done to check on your health and the probable health of your baby. Blood work is used to follow your blood levels (hemoglobin). Anemia (low hemoglobin) is common during pregnancy. Iron and vitamins are given to help prevent this. You will also be checked for diabetes between 24 and 28 weeks of the pregnancy. Some of the previous blood tests may be repeated.  The size of the uterus is measured during each visit. This is to make sure that the baby is continuing to grow properly according to the dates of the pregnancy.  Your blood pressure is checked every prenatal visit. This is to make sure you are not getting toxemia.  Your urine is checked to make sure you do not have an infection, diabetes or protein in the urine.  Your weight is checked often to make sure gains are happening at the suggested rate. This is to ensure that both you and your baby are  growing normally.  Sometimes, an ultrasound is performed to confirm the proper growth and development of the baby. This is a test which bounces harmless sound waves off the baby so your caregiver can more accurately determine due dates. Sometimes, a test is done on the amniotic fluid surrounding the baby. This test is called an amniocentesis. The amniotic fluid is obtained by sticking a needle into the belly (abdomen). This is done to check the chromosomes in instances where there is a concern about possible genetic problems with the baby. It is also sometimes done near the end of pregnancy if an early delivery is required. In this case, it is done to help make sure the baby's lungs are mature enough for the baby to live outside of the womb. CHANGES OCCURING IN THE SECOND TRIMESTER OF PREGNANCY Your body goes through many changes during pregnancy. They vary from person to person. Talk to your caregiver about changes you notice that you are concerned about.  During the second trimester, you will likely have an increase in your appetite. It is normal to have cravings for certain foods. This varies from person to person and pregnancy to pregnancy.  Your lower abdomen will begin to bulge.  You may have to urinate more often because the uterus and baby are pressing on your bladder. It is also common to get more bladder infections during pregnancy. You can help this by drinking lots of fluids   and emptying your bladder before and after intercourse.  You may begin to get stretch marks on your hips, abdomen, and breasts. These are normal changes in the body during pregnancy. There are no exercises or medicines to take that prevent this change.  You may begin to develop swollen and bulging veins (varicose veins) in your legs. Wearing support hose, elevating your feet for 15 minutes, 3 to 4 times a day and limiting salt in your diet helps lessen the problem.  Heartburn may develop as the uterus grows and  pushes up against the stomach. Antacids recommended by your caregiver helps with this problem. Also, eating smaller meals 4 to 5 times a day helps.  Constipation can be treated with a stool softener or adding bulk to your diet. Drinking lots of fluids, and eating vegetables, fruits, and whole grains are helpful.  Exercising is also helpful. If you have been very active up until your pregnancy, most of these activities can be continued during your pregnancy. If you have been less active, it is helpful to start an exercise program such as walking.  Hemorrhoids may develop at the end of the second trimester. Warm sitz baths and hemorrhoid cream recommended by your caregiver helps hemorrhoid problems.  Backaches may develop during this time of your pregnancy. Avoid heavy lifting, wear low heal shoes, and practice good posture to help with backache problems.  Some pregnant women develop tingling and numbness of their hand and fingers because of swelling and tightening of ligaments in the wrist (carpel tunnel syndrome). This goes away after the baby is born.  As your breasts enlarge, you may have to get a bigger bra. Get a comfortable, cotton, support bra. Do not get a nursing bra until the last month of the pregnancy if you will be nursing the baby.  You may get a dark line from your belly button to the pubic area called the linea nigra.  You may develop rosy cheeks because of increase blood flow to the face.  You may develop spider looking lines of the face, neck, arms, and chest. These go away after the baby is born. HOME CARE INSTRUCTIONS   It is extremely important to avoid all smoking, herbs, alcohol, and unprescribed drugs during your pregnancy. These chemicals affect the formation and growth of the baby. Avoid these chemicals throughout the pregnancy to ensure the delivery of a healthy infant.  Most of your home care instructions are the same as suggested for the first trimester of your  pregnancy. Keep your caregiver's appointments. Follow your caregiver's instructions regarding medicine use, exercise, and diet.  During pregnancy, you are providing food for you and your baby. Continue to eat regular, well-balanced meals. Choose foods such as meat, fish, milk and other low fat dairy products, vegetables, fruits, and whole-grain breads and cereals. Your caregiver will tell you of the ideal weight gain.  A physical sexual relationship may be continued up until near the end of pregnancy if there are no other problems. Problems could include early (premature) leaking of amniotic fluid from the membranes, vaginal bleeding, abdominal pain, or other medical or pregnancy problems.  Exercise regularly if there are no restrictions. Check with your caregiver if you are unsure of the safety of some of your exercises. The greatest weight gain will occur in the last 2 trimesters of pregnancy. Exercise will help you:  Control your weight.  Get you in shape for labor and delivery.  Lose weight after you have the baby.  Wear   a good support or jogging bra for breast tenderness during pregnancy. This may help if worn during sleep. Pads or tissues may be used in the bra if you are leaking colostrum.  Do not use hot tubs, steam rooms or saunas throughout the pregnancy.  Wear your seat belt at all times when driving. This protects you and your baby if you are in an accident.  Avoid raw meat, uncooked cheese, cat litter boxes, and soil used by cats. These carry germs that can cause birth defects in the baby.  The second trimester is also a good time to visit your dentist for your dental health if this has not been done yet. Getting your teeth cleaned is okay. Use a soft toothbrush. Brush gently during pregnancy.  It is easier to leak urine during pregnancy. Tightening up and strengthening the pelvic muscles will help with this problem. Practice stopping your urination while you are going to the  bathroom. These are the same muscles you need to strengthen. It is also the muscles you would use as if you were trying to stop from passing gas. You can practice tightening these muscles up 10 times a set and repeating this about 3 times per day. Once you know what muscles to tighten up, do not perform these exercises during urination. It is more likely to contribute to an infection by backing up the urine.  Ask for help if you have financial, counseling, or nutritional needs during pregnancy. Your caregiver will be able to offer counseling for these needs as well as refer you for other special needs.  Your skin may become oily. If so, wash your face with mild soap, use non-greasy moisturizer and oil or cream based makeup. MEDICINES AND DRUG USE IN PREGNANCY  Take prenatal vitamins as directed. The vitamin should contain 1 milligram of folic acid. Keep all vitamins out of reach of children. Only a couple vitamins or tablets containing iron may be fatal to a baby or young child when ingested.  Avoid use of all medicines, including herbs, over-the-counter medicines, not prescribed or suggested by your caregiver. Only take over-the-counter or prescription medicines for pain, discomfort, or fever as directed by your caregiver. Do not use aspirin.  Let your caregiver also know about herbs you may be using.  Alcohol is related to a number of birth defects. This includes fetal alcohol syndrome. All alcohol, in any form, should be avoided completely. Smoking will cause low birth rate and premature babies.  Street or illegal drugs are very harmful to the baby. They are absolutely forbidden. A baby born to an addicted mother will be addicted at birth. The baby will go through the same withdrawal an adult does. SEEK MEDICAL CARE IF:  You have any concerns or worries during your pregnancy. It is better to call with your questions if you feel they cannot wait, rather than worry about them. SEEK IMMEDIATE  MEDICAL CARE IF:   An unexplained oral temperature above 102 F (38.9 C) develops, or as your caregiver suggests.  You have leaking of fluid from the vagina (birth canal). If leaking membranes are suspected, take your temperature and tell your caregiver of this when you call.  There is vaginal spotting, bleeding, or passing clots. Tell your caregiver of the amount and how many pads are used. Light spotting in pregnancy is common, especially following intercourse.  You develop a bad smelling vaginal discharge with a change in the color from clear to white.  You continue to feel   sick to your stomach (nauseated) and have no relief from remedies suggested. You vomit blood or coffee ground-like materials.  You lose more than 2 pounds of weight or gain more than 2 pounds of weight over 1 week, or as suggested by your caregiver.  You notice swelling of your face, hands, feet, or legs.  You get exposed to German measles and have never had them.  You are exposed to fifth disease or chickenpox.  You develop belly (abdominal) pain. Round ligament discomfort is a common non-cancerous (benign) cause of abdominal pain in pregnancy. Your caregiver still must evaluate you.  You develop a bad headache that does not go away.  You develop fever, diarrhea, pain with urination, or shortness of breath.  You develop visual problems, blurry, or double vision.  You fall or are in a car accident or any kind of trauma.  There is mental or physical violence at home. Document Released: 04/07/2001 Document Revised: 01/06/2012 Document Reviewed: 10/10/2008 [employer] Patient Information 2014 [employer].  

## 2012-12-28 NOTE — Progress Notes (Signed)
P=86,  Needs to get headache med changed because not on shelves anymore. Still having headaches.

## 2012-12-28 NOTE — Progress Notes (Signed)
Pt is a G3P1011 @ [redacted]w[redacted]d here for routine OBV  S- pt dong well. Having headaches intermittently that were controlled with fiorecet but states last rx is off the market and needs new one.  No ctx, vb, LOF.    O; see flowsheet  A/p: - offered MSAFP for neural tube defect as per first trimester screen recs. Pt declined at this time -would like to wait for [place] - new fiorecet sent to pharmacy - return precautions discussed -anatomy US scheduled.

## 2013-01-24 ENCOUNTER — Encounter (HOSPITAL_COMMUNITY): Payer: Self-pay

## 2013-01-24 ENCOUNTER — Ambulatory Visit (HOSPITAL_COMMUNITY)
Admission: RE | Admit: 2013-01-24 | Discharge: 2013-01-24 | Disposition: A | Discharge status: Home / Self Care | Payer: Medicaid Other | Source: Ambulatory Visit | Attending: Family Medicine | Admitting: Family Medicine

## 2013-01-24 VITALS — BP 120/71 | HR 95 | Wt 219.0 lb

## 2013-01-24 DIAGNOSIS — O34599 Maternal care for other abnormalities of gravid uterus, unspecified trimester: Secondary | ICD-10-CM | POA: Insufficient documentation

## 2013-01-24 DIAGNOSIS — Z363 Encounter for antenatal screening for malformations: Secondary | ICD-10-CM | POA: Insufficient documentation

## 2013-01-24 DIAGNOSIS — O358XX0 Maternal care for other (suspected) fetal abnormality and damage, not applicable or unspecified: Secondary | ICD-10-CM | POA: Insufficient documentation

## 2013-01-24 DIAGNOSIS — IMO0002 Reserved for concepts with insufficient information to code with codable children: Secondary | ICD-10-CM

## 2013-01-24 DIAGNOSIS — Z1389 Encounter for screening for other disorder: Secondary | ICD-10-CM | POA: Insufficient documentation

## 2013-01-25 ENCOUNTER — Ambulatory Visit (INDEPENDENT_AMBULATORY_CARE_PROVIDER_SITE_OTHER): Payer: Medicaid Other | Admitting: Family Medicine

## 2013-01-25 ENCOUNTER — Encounter: Payer: Self-pay | Admitting: Family Medicine

## 2013-01-25 VITALS — BP 110/70 | Temp 98.3°F | Wt 216.0 lb

## 2013-01-25 DIAGNOSIS — Z23 Encounter for immunization: Secondary | ICD-10-CM

## 2013-01-25 DIAGNOSIS — Z3482 Encounter for supervision of other normal pregnancy, second trimester: Secondary | ICD-10-CM

## 2013-01-25 DIAGNOSIS — IMO0002 Reserved for concepts with insufficient information to code with codable children: Secondary | ICD-10-CM

## 2013-01-25 LAB — POCT URINALYSIS DIP (DEVICE)
Bilirubin Urine: NEGATIVE
Glucose, UA: NEGATIVE mg/dL
Hgb urine dipstick: NEGATIVE
Leukocytes, UA: NEGATIVE
Nitrite: NEGATIVE
Urobilinogen, UA: 0.2 mg/dL (ref 0.0–1.0)
pH: 7 (ref 5.0–8.0)

## 2013-01-25 MED ORDER — INFLUENZA VIRUS VACC SPLIT PF IM SUSP
0.5000 mL | Freq: Once | INTRAMUSCULAR | Status: AC
  Administered 2013-01-25: 0.5 mL via INTRAMUSCULAR

## 2013-01-25 NOTE — Patient Instructions (Signed)
Pregnancy - Second Trimester The second trimester of pregnancy (3 to 6 months) is a period of rapid growth for you and your baby. At the end of the sixth month, your baby is about 9 inches long and weighs 1 1/2 pounds. You will begin to feel the baby move between 18 and 20 weeks of the pregnancy. This is called quickening. Weight gain is faster. A clear fluid (colostrum) may leak out of your breasts. You may feel small contractions of the womb (uterus). This is known as false labor or Braxton-Hicks contractions. This is like a practice for labor when the baby is ready to be born. Usually, the problems with morning sickness have usually passed by the end of your first trimester. Some women develop small dark blotches (called cholasma, mask of pregnancy) on their face that usually goes away after the baby is born. Exposure to the sun makes the blotches worse. Acne may also develop in some pregnant women and pregnant women who have acne, may find that it goes away. PRENATAL EXAMS  Blood work may continue to be done during prenatal exams. These tests are done to check on your health and the probable health of your baby. Blood work is used to follow your blood levels (hemoglobin). Anemia (low hemoglobin) is common during pregnancy. Iron and vitamins are given to help prevent this. You will also be checked for diabetes between 24 and 28 weeks of the pregnancy. Some of the previous blood tests may be repeated.  The size of the uterus is measured during each visit. This is to make sure that the baby is continuing to grow properly according to the dates of the pregnancy.  Your blood pressure is checked every prenatal visit. This is to make sure you are not getting toxemia.  Your urine is checked to make sure you do not have an infection, diabetes or protein in the urine.  Your weight is checked often to make sure gains are happening at the suggested rate. This is to ensure that both you and your baby are  growing normally.  Sometimes, an ultrasound is performed to confirm the proper growth and development of the baby. This is a test which bounces harmless sound waves off the baby so your caregiver can more accurately determine due dates. Sometimes, a test is done on the amniotic fluid surrounding the baby. This test is called an amniocentesis. The amniotic fluid is obtained by sticking a needle into the belly (abdomen). This is done to check the chromosomes in instances where there is a concern about possible genetic problems with the baby. It is also sometimes done near the end of pregnancy if an early delivery is required. In this case, it is done to help make sure the baby's lungs are mature enough for the baby to live outside of the womb. CHANGES OCCURING IN THE SECOND TRIMESTER OF PREGNANCY Your body goes through many changes during pregnancy. They vary from person to person. Talk to your caregiver about changes you notice that you are concerned about.  During the second trimester, you will likely have an increase in your appetite. It is normal to have cravings for certain foods. This varies from person to person and pregnancy to pregnancy.  Your lower abdomen will begin to bulge.  You may have to urinate more often because the uterus and baby are pressing on your bladder. It is also common to get more bladder infections during pregnancy. You can help this by drinking lots of fluids   and emptying your bladder before and after intercourse.  You may begin to get stretch marks on your hips, abdomen, and breasts. These are normal changes in the body during pregnancy. There are no exercises or medicines to take that prevent this change.  You may begin to develop swollen and bulging veins (varicose veins) in your legs. Wearing support hose, elevating your feet for 15 minutes, 3 to 4 times a day and limiting salt in your diet helps lessen the problem.  Heartburn may develop as the uterus grows and  pushes up against the stomach. Antacids recommended by your caregiver helps with this problem. Also, eating smaller meals 4 to 5 times a day helps.  Constipation can be treated with a stool softener or adding bulk to your diet. Drinking lots of fluids, and eating vegetables, fruits, and whole grains are helpful.  Exercising is also helpful. If you have been very active up until your pregnancy, most of these activities can be continued during your pregnancy. If you have been less active, it is helpful to start an exercise program such as walking.  Hemorrhoids may develop at the end of the second trimester. Warm sitz baths and hemorrhoid cream recommended by your caregiver helps hemorrhoid problems.  Backaches may develop during this time of your pregnancy. Avoid heavy lifting, wear low heal shoes, and practice good posture to help with backache problems.  Some pregnant women develop tingling and numbness of their hand and fingers because of swelling and tightening of ligaments in the wrist (carpel tunnel syndrome). This goes away after the baby is born.  As your breasts enlarge, you may have to get a bigger bra. Get a comfortable, cotton, support bra. Do not get a nursing bra until the last month of the pregnancy if you will be nursing the baby.  You may get a dark line from your belly button to the pubic area called the linea nigra.  You may develop rosy cheeks because of increase blood flow to the face.  You may develop spider looking lines of the face, neck, arms, and chest. These go away after the baby is born. HOME CARE INSTRUCTIONS   It is extremely important to avoid all smoking, herbs, alcohol, and unprescribed drugs during your pregnancy. These chemicals affect the formation and growth of the baby. Avoid these chemicals throughout the pregnancy to ensure the delivery of a healthy infant.  Most of your home care instructions are the same as suggested for the first trimester of your  pregnancy. Keep your caregiver's appointments. Follow your caregiver's instructions regarding medicine use, exercise, and diet.  During pregnancy, you are providing food for you and your baby. Continue to eat regular, well-balanced meals. Choose foods such as meat, fish, milk and other low fat dairy products, vegetables, fruits, and whole-grain breads and cereals. Your caregiver will tell you of the ideal weight gain.  A physical sexual relationship may be continued up until near the end of pregnancy if there are no other problems. Problems could include early (premature) leaking of amniotic fluid from the membranes, vaginal bleeding, abdominal pain, or other medical or pregnancy problems.  Exercise regularly if there are no restrictions. Check with your caregiver if you are unsure of the safety of some of your exercises. The greatest weight gain will occur in the last 2 trimesters of pregnancy. Exercise will help you:  Control your weight.  Get you in shape for labor and delivery.  Lose weight after you have the baby.  Wear   a good support or jogging bra for breast tenderness during pregnancy. This may help if worn during sleep. Pads or tissues may be used in the bra if you are leaking colostrum.  Do not use hot tubs, steam rooms or saunas throughout the pregnancy.  Wear your seat belt at all times when driving. This protects you and your baby if you are in an accident.  Avoid raw meat, uncooked cheese, cat litter boxes, and soil used by cats. These carry germs that can cause birth defects in the baby.  The second trimester is also a good time to visit your dentist for your dental health if this has not been done yet. Getting your teeth cleaned is okay. Use a soft toothbrush. Brush gently during pregnancy.  It is easier to leak urine during pregnancy. Tightening up and strengthening the pelvic muscles will help with this problem. Practice stopping your urination while you are going to the  bathroom. These are the same muscles you need to strengthen. It is also the muscles you would use as if you were trying to stop from passing gas. You can practice tightening these muscles up 10 times a set and repeating this about 3 times per day. Once you know what muscles to tighten up, do not perform these exercises during urination. It is more likely to contribute to an infection by backing up the urine.  Ask for help if you have financial, counseling, or nutritional needs during pregnancy. Your caregiver will be able to offer counseling for these needs as well as refer you for other special needs.  Your skin may become oily. If so, wash your face with mild soap, use non-greasy moisturizer and oil or cream based makeup. MEDICINES AND DRUG USE IN PREGNANCY  Take prenatal vitamins as directed. The vitamin should contain 1 milligram of folic acid. Keep all vitamins out of reach of children. Only a couple vitamins or tablets containing iron may be fatal to a baby or young child when ingested.  Avoid use of all medicines, including herbs, over-the-counter medicines, not prescribed or suggested by your caregiver. Only take over-the-counter or prescription medicines for pain, discomfort, or fever as directed by your caregiver. Do not use aspirin.  Let your caregiver also know about herbs you may be using.  Alcohol is related to a number of birth defects. This includes fetal alcohol syndrome. All alcohol, in any form, should be avoided completely. Smoking will cause low birth rate and premature babies.  Street or illegal drugs are very harmful to the baby. They are absolutely forbidden. A baby born to an addicted mother will be addicted at birth. The baby will go through the same withdrawal an adult does. SEEK MEDICAL CARE IF:  You have any concerns or worries during your pregnancy. It is better to call with your questions if you feel they cannot wait, rather than worry about them. SEEK IMMEDIATE  MEDICAL CARE IF:   An unexplained oral temperature above 102 F (38.9 C) develops, or as your caregiver suggests.  You have leaking of fluid from the vagina (birth canal). If leaking membranes are suspected, take your temperature and tell your caregiver of this when you call.  There is vaginal spotting, bleeding, or passing clots. Tell your caregiver of the amount and how many pads are used. Light spotting in pregnancy is common, especially following intercourse.  You develop a bad smelling vaginal discharge with a change in the color from clear to white.  You continue to feel   sick to your stomach (nauseated) and have no relief from remedies suggested. You vomit blood or coffee ground-like materials.  You lose more than 2 pounds of weight or gain more than 2 pounds of weight over 1 week, or as suggested by your caregiver.  You notice swelling of your face, hands, feet, or legs.  You get exposed to German measles and have never had them.  You are exposed to fifth disease or chickenpox.  You develop belly (abdominal) pain. Round ligament discomfort is a common non-cancerous (benign) cause of abdominal pain in pregnancy. Your caregiver still must evaluate you.  You develop a bad headache that does not go away.  You develop fever, diarrhea, pain with urination, or shortness of breath.  You develop visual problems, blurry, or double vision.  You fall or are in a car accident or any kind of trauma.  There is mental or physical violence at home. Document Released: 04/07/2001 Document Revised: 01/06/2012 Document Reviewed: 10/10/2008 [employer] Patient Information 2014 [employer].  

## 2013-01-25 NOTE — Addendum Note (Signed)
Addended by: Sherre Lain A on: 01/25/2013 02:16 PM   Modules accepted: Orders

## 2013-01-25 NOTE — Progress Notes (Signed)
27 yo G3P1011 @ [redacted]w[redacted]d here for ROBV - pt doing well  - has a left dermoid cyst - no ctx, lof, vb. +FM  O: see flowsheet  A/P:  - doing well overall - AFP drawn today, normal 1st trimester screen - discussed that dermoid cyst would likely be addressed post pregnancy - flu shot today - f/u in 4 weeks.

## 2013-01-25 NOTE — Progress Notes (Signed)
Pulse: 81

## 2013-01-26 LAB — ALPHA FETOPROTEIN, MATERNAL
AFP: 32.8 IU/mL
Osb Risk: 1:37700 {titer}

## 2013-02-22 ENCOUNTER — Ambulatory Visit (INDEPENDENT_AMBULATORY_CARE_PROVIDER_SITE_OTHER): Payer: Medicaid Other | Admitting: Family

## 2013-02-22 VITALS — BP 110/71 | Temp 98.6°F | Wt 221.5 lb

## 2013-02-22 DIAGNOSIS — N83201 Unspecified ovarian cyst, right side: Secondary | ICD-10-CM

## 2013-02-22 DIAGNOSIS — IMO0002 Reserved for concepts with insufficient information to code with codable children: Secondary | ICD-10-CM

## 2013-02-22 DIAGNOSIS — N83209 Unspecified ovarian cyst, unspecified side: Secondary | ICD-10-CM

## 2013-02-22 DIAGNOSIS — Z3482 Encounter for supervision of other normal pregnancy, second trimester: Secondary | ICD-10-CM

## 2013-02-22 DIAGNOSIS — D270 Benign neoplasm of right ovary: Secondary | ICD-10-CM | POA: Insufficient documentation

## 2013-02-22 LAB — POCT URINALYSIS DIP (DEVICE)
Bilirubin Urine: NEGATIVE
Glucose, UA: NEGATIVE mg/dL
Hgb urine dipstick: NEGATIVE
Ketones, ur: NEGATIVE mg/dL
Specific Gravity, Urine: 1.015 (ref 1.005–1.030)

## 2013-02-22 NOTE — Progress Notes (Signed)
Reports intermittent right sided pain (current 7 cm dermoid on right), "not as bad as my last cyst".  Informed patient to let us know when/if pain worsens.  Reviewed ultrasound results.

## 2013-02-22 NOTE — Progress Notes (Signed)
P= 99,  C/o sharp pains sometimes on right upper side.

## 2013-03-22 ENCOUNTER — Encounter: Payer: Self-pay | Admitting: Family Medicine

## 2013-03-22 ENCOUNTER — Ambulatory Visit (INDEPENDENT_AMBULATORY_CARE_PROVIDER_SITE_OTHER): Payer: Medicaid Other | Admitting: Family Medicine

## 2013-03-22 VITALS — BP 106/63 | Temp 98.7°F | Wt 226.0 lb

## 2013-03-22 DIAGNOSIS — Z3482 Encounter for supervision of other normal pregnancy, second trimester: Secondary | ICD-10-CM

## 2013-03-22 DIAGNOSIS — IMO0002 Reserved for concepts with insufficient information to code with codable children: Secondary | ICD-10-CM

## 2013-03-22 DIAGNOSIS — R51 Headache: Secondary | ICD-10-CM

## 2013-03-22 LAB — POCT URINALYSIS DIP (DEVICE)
Bilirubin Urine: NEGATIVE
Hgb urine dipstick: NEGATIVE
Ketones, ur: NEGATIVE mg/dL
Specific Gravity, Urine: 1.015 (ref 1.005–1.030)
pH: 7 (ref 5.0–8.0)

## 2013-03-22 MED ORDER — BUTALBITAL-ASPIRIN-CAFFEINE 50-325-40 MG PO TABS: 1.0000 | ORAL_TABLET | Freq: Two times a day (BID) | ORAL | Status: DC | PRN

## 2013-03-22 NOTE — Progress Notes (Signed)
Victoria Trujillo is a 27 y.o. G3P1011 at [redacted]w[redacted]d  here for ROB visit.  +FM, no LOF, no VB, no CTX  Discussed with Patient:  -Plans to breast feed.  All questions answered. -Continue prenatal vitamins. - Reviewed genetics screen (Quad screen / first trimester screen / serum integrated screen / full integrated screen done/ not done).   - Reviewed fetal kick counts (Pt to perform daily at a time when the baby is active, lie laterally with both hands on belly in quiet room and count all movements (hiccups, shoulder rolls, obvious kicks, etc); pt is to report to clinic or MAU for less than 10 movements felt in a one hour time period-pt told as soon as she counts 10 movements the count is complete.)  - Routine precautions discussed (depression, infection s/s).   Patient provided with all pertinent phone numbers for emergencies. - RTC for any VB, regular, painful cramps/ctxs occurring at a rate of >2/10 min, fever (100.5 or higher), n/v/d, any pain that is unresolving or worsening, LOF, decreased fetal movement, CP, SOB, edema  Problems: Patient Active Problem List   Diagnosis Date Noted  . Right ovarian cyst 02/22/2013  . Edema or excessive weight gain, antepartum 12/02/2012  . Supervision of normal subsequent pregnancy 11/02/2012    To Do: 1. Glucose tolerance in 2 weeks 2. CBC and antibody screen next visit 3. Rhogam given (if Rh-)  [ ]  Vaccines: Flu: recd Tdap:  [ ]  BCM: undecided  Edu: [ x] PTL precautions; [ ]  BF class; [ ]  childbirth class; [ ]   BF counseling;

## 2013-03-22 NOTE — Progress Notes (Signed)
Pulse: 89

## 2013-03-22 NOTE — Patient Instructions (Signed)
Second Trimester of Pregnancy The second trimester is from week 13 through week 28, months 4 through 6. The second trimester is often a time when you feel your best. Your body has also adjusted to being pregnant, and you begin to feel better physically. Usually, morning sickness has lessened or quit completely, you may have more energy, and you may have an increase in appetite. The second trimester is also a time when the fetus is growing rapidly. At the end of the sixth month, the fetus is about 9 inches long and weighs about 1 pounds. You will likely begin to feel the baby move (quickening) between 18 and 20 weeks of the pregnancy. BODY CHANGES Your body goes through many changes during pregnancy. The changes vary from woman to woman.   Your weight will continue to increase. You will notice your lower abdomen bulging out.  You may begin to get stretch marks on your hips, abdomen, and breasts.  You may develop headaches that can be relieved by medicines approved by your caregiver.  You may urinate more often because the fetus is pressing on your bladder.  You may develop or continue to have heartburn as a result of your pregnancy.  You may develop constipation because certain hormones are causing the muscles that push waste through your intestines to slow down.  You may develop hemorrhoids or swollen, bulging veins (varicose veins).  You may have back pain because of the weight gain and pregnancy hormones relaxing your joints between the bones in your pelvis and as a result of a shift in weight and the muscles that support your balance.  Your breasts will continue to grow and be tender.  Your gums may bleed and may be sensitive to brushing and flossing.  Dark spots or blotches (chloasma, mask of pregnancy) may develop on your face. This will likely fade after the baby is born.  A dark line from your belly button to the pubic area (linea nigra) may appear. This will likely fade after the  baby is born. WHAT TO EXPECT AT YOUR PRENATAL VISITS During a routine prenatal visit:  You will be weighed to make sure you and the fetus are growing normally.  Your blood pressure will be taken.  Your abdomen will be measured to track your baby's growth.  The fetal heartbeat will be listened to.  Any test results from the previous visit will be discussed. Your caregiver may ask you:  How you are feeling.  If you are feeling the baby move.  If you have had any abnormal symptoms, such as leaking fluid, bleeding, severe headaches, or abdominal cramping.  If you have any questions. Other tests that may be performed during your second trimester include:  Blood tests that check for:  Low iron levels (anemia).  Gestational diabetes (between 24 and 28 weeks).  Rh antibodies.  Urine tests to check for infections, diabetes, or protein in the urine.  An ultrasound to confirm the proper growth and development of the baby.  An amniocentesis to check for possible genetic problems.  Fetal screens for spina bifida and Down syndrome. HOME CARE INSTRUCTIONS   Avoid all smoking, herbs, alcohol, and unprescribed drugs. These chemicals affect the formation and growth of the baby.  Follow your caregiver's instructions regarding medicine use. There are medicines that are either safe or unsafe to take during pregnancy.  Exercise only as directed by your caregiver. Experiencing uterine cramps is a good sign to stop exercising.  Continue to eat regular,   healthy meals.  Wear a good support bra for breast tenderness.  Do not use hot tubs, steam rooms, or saunas.  Wear your seat belt at all times when driving.  Avoid raw meat, uncooked cheese, cat litter boxes, and soil used by cats. These carry germs that can cause birth defects in the baby.  Take your prenatal vitamins.  Try taking a stool softener (if your caregiver approves) if you develop constipation. Eat more high-fiber foods,  such as fresh vegetables or fruit and whole grains. Drink plenty of fluids to keep your urine clear or pale yellow.  Take warm sitz baths to soothe any pain or discomfort caused by hemorrhoids. Use hemorrhoid cream if your caregiver approves.  If you develop varicose veins, wear support hose. Elevate your feet for 15 minutes, 3 4 times a day. Limit salt in your diet.  Avoid heavy lifting, wear low heel shoes, and practice good posture.  Rest with your legs elevated if you have leg cramps or low back pain.  Visit your dentist if you have not gone yet during your pregnancy. Use a soft toothbrush to brush your teeth and be gentle when you floss.  A sexual relationship may be continued unless your caregiver directs you otherwise.  Continue to go to all your prenatal visits as directed by your caregiver. SEEK MEDICAL CARE IF:   You have dizziness.  You have mild pelvic cramps, pelvic pressure, or nagging pain in the abdominal area.  You have persistent nausea, vomiting, or diarrhea.  You have a bad smelling vaginal discharge.  You have pain with urination. SEEK IMMEDIATE MEDICAL CARE IF:   You have a fever.  You are leaking fluid from your vagina.  You have spotting or bleeding from your vagina.  You have severe abdominal cramping or pain.  You have rapid weight gain or loss.  You have shortness of breath with chest pain.  You notice sudden or extreme swelling of your face, hands, ankles, feet, or legs.  You have not felt your baby move in over an hour.  You have severe headaches that do not go away with medicine.  You have vision changes. Document Released: 04/07/2001 Document Revised: 12/14/2012 Document Reviewed: 06/14/2012 [employer] Patient Information 2014 [employer].  

## 2013-04-07 ENCOUNTER — Ambulatory Visit (INDEPENDENT_AMBULATORY_CARE_PROVIDER_SITE_OTHER): Payer: Medicaid Other | Admitting: Advanced Practice Midwife

## 2013-04-07 VITALS — BP 113/68 | Wt 230.5 lb

## 2013-04-07 DIAGNOSIS — Z3483 Encounter for supervision of other normal pregnancy, third trimester: Secondary | ICD-10-CM

## 2013-04-07 DIAGNOSIS — Z23 Encounter for immunization: Secondary | ICD-10-CM

## 2013-04-07 DIAGNOSIS — IMO0002 Reserved for concepts with insufficient information to code with codable children: Secondary | ICD-10-CM

## 2013-04-07 LAB — CBC
HCT: 33.5 % — ABNORMAL LOW (ref 36.0–46.0)
Hemoglobin: 11.3 g/dL — ABNORMAL LOW (ref 12.0–15.0)
MCH: 29.3 pg (ref 26.0–34.0)
MCHC: 33.7 g/dL (ref 30.0–36.0)
MCV: 86.8 fL (ref 78.0–100.0)
RDW: 14.1 % (ref 11.5–15.5)

## 2013-04-07 LAB — HIV ANTIBODY (ROUTINE TESTING W REFLEX): HIV: NONREACTIVE

## 2013-04-07 LAB — POCT URINALYSIS DIP (DEVICE)
Bilirubin Urine: NEGATIVE
Glucose, UA: NEGATIVE mg/dL
Hgb urine dipstick: NEGATIVE
Ketones, ur: NEGATIVE mg/dL
pH: 7 (ref 5.0–8.0)

## 2013-04-07 MED ORDER — TETANUS-DIPHTH-ACELL PERTUSSIS 5-2.5-18.5 LF-MCG/0.5 IM SUSP: 0.5000 mL | Freq: Once | INTRAMUSCULAR | Status: DC

## 2013-04-07 NOTE — Progress Notes (Signed)
GTT and 28 week labs. Active baby.

## 2013-04-07 NOTE — Progress Notes (Signed)
Pulse: 81 28 week labs today

## 2013-04-07 NOTE — Patient Instructions (Signed)
Third Trimester of Pregnancy  The third trimester is from week 29 through week 42, months 7 through 9. The third trimester is a time when the fetus is growing rapidly. At the end of the ninth month, the fetus is about 20 inches in length and weighs 6 10 pounds.   BODY CHANGES  Your body goes through many changes during pregnancy. The changes vary from woman to woman.    Your weight will continue to increase. You can expect to gain 25 35 pounds (11 16 kg) by the end of the pregnancy.   You may begin to get stretch marks on your hips, abdomen, and breasts.   You may urinate more often because the fetus is moving lower into your pelvis and pressing on your bladder.   You may develop or continue to have heartburn as a result of your pregnancy.   You may develop constipation because certain hormones are causing the muscles that push waste through your intestines to slow down.   You may develop hemorrhoids or swollen, bulging veins (varicose veins).   You may have pelvic pain because of the weight gain and pregnancy hormones relaxing your joints between the bones in your pelvis. Back aches may result from over exertion of the muscles supporting your posture.   Your breasts will continue to grow and be tender. A yellow discharge may leak from your breasts called colostrum.   Your belly button may stick out.   You may feel short of breath because of your expanding uterus.   You may notice the fetus "dropping," or moving lower in your abdomen.   You may have a bloody mucus discharge. This usually occurs a few days to a week before labor begins.   Your cervix becomes thin and soft (effaced) near your due date.  WHAT TO EXPECT AT YOUR PRENATAL EXAMS   You will have prenatal exams every 2 weeks until week 36. Then, you will have weekly prenatal exams. During a routine prenatal visit:   You will be weighed to make sure you and the fetus are growing normally.   Your blood pressure is taken.   Your abdomen will be  measured to track your baby's growth.   The fetal heartbeat will be listened to.   Any test results from the previous visit will be discussed.   You may have a cervical check near your due date to see if you have effaced.  At around 36 weeks, your caregiver will check your cervix. At the same time, your caregiver will also perform a test on the secretions of the vaginal tissue. This test is to determine if a type of bacteria, Group B streptococcus, is present. Your caregiver will explain this further.  Your caregiver may ask you:   What your birth plan is.   How you are feeling.   If you are feeling the baby move.   If you have had any abnormal symptoms, such as leaking fluid, bleeding, severe headaches, or abdominal cramping.   If you have any questions.  Other tests or screenings that may be performed during your third trimester include:   Blood tests that check for low iron levels (anemia).   Fetal testing to check the health, activity level, and growth of the fetus. Testing is done if you have certain medical conditions or if there are problems during the pregnancy.  FALSE LABOR  You may feel small, irregular contractions that eventually go away. These are called Braxton Hicks contractions, or   false labor. Contractions may last for hours, days, or even weeks before true labor sets in. If contractions come at regular intervals, intensify, or become painful, it is best to be seen by your caregiver.   SIGNS OF LABOR    Menstrual-like cramps.   Contractions that are 5 minutes apart or less.   Contractions that start on the top of the uterus and spread down to the lower abdomen and back.   A sense of increased pelvic pressure or back pain.   A watery or bloody mucus discharge that comes from the vagina.  If you have any of these signs before the 37th week of pregnancy, call your caregiver right away. You need to go to the hospital to get checked immediately.  HOME CARE INSTRUCTIONS    Avoid all  smoking, herbs, alcohol, and unprescribed drugs. These chemicals affect the formation and growth of the baby.   Follow your caregiver's instructions regarding medicine use. There are medicines that are either safe or unsafe to take during pregnancy.   Exercise only as directed by your caregiver. Experiencing uterine cramps is a good sign to stop exercising.   Continue to eat regular, healthy meals.   Wear a good support bra for breast tenderness.   Do not use hot tubs, steam rooms, or saunas.   Wear your seat belt at all times when driving.   Avoid raw meat, uncooked cheese, cat litter boxes, and soil used by cats. These carry germs that can cause birth defects in the baby.   Take your prenatal vitamins.   Try taking a stool softener (if your caregiver approves) if you develop constipation. Eat more high-fiber foods, such as fresh vegetables or fruit and whole grains. Drink plenty of fluids to keep your urine clear or pale yellow.   Take warm sitz baths to soothe any pain or discomfort caused by hemorrhoids. Use hemorrhoid cream if your caregiver approves.   If you develop varicose veins, wear support hose. Elevate your feet for 15 minutes, 3 4 times a day. Limit salt in your diet.   Avoid heavy lifting, wear low heal shoes, and practice good posture.   Rest a lot with your legs elevated if you have leg cramps or low back pain.   Visit your dentist if you have not gone during your pregnancy. Use a soft toothbrush to brush your teeth and be gentle when you floss.   A sexual relationship may be continued unless your caregiver directs you otherwise.   Do not travel far distances unless it is absolutely necessary and only with the approval of your caregiver.   Take prenatal classes to understand, practice, and ask questions about the labor and delivery.   Make a trial run to the hospital.   Pack your hospital bag.   Prepare the baby's nursery.   Continue to go to all your prenatal visits as directed  by your caregiver.  SEEK MEDICAL CARE IF:   You are unsure if you are in labor or if your water has broken.   You have dizziness.   You have mild pelvic cramps, pelvic pressure, or nagging pain in your abdominal area.   You have persistent nausea, vomiting, or diarrhea.   You have a bad smelling vaginal discharge.   You have pain with urination.  SEEK IMMEDIATE MEDICAL CARE IF:    You have a fever.   You are leaking fluid from your vagina.   You have spotting or bleeding from your vagina.     You have severe abdominal cramping or pain.   You have rapid weight loss or gain.   You have shortness of breath with chest pain.   You notice sudden or extreme swelling of your face, hands, ankles, feet, or legs.   You have not felt your baby move in over an hour.   You have severe headaches that do not go away with medicine.   You have vision changes.  Document Released: 04/07/2001 Document Revised: 12/14/2012 Document Reviewed: 06/14/2012  [employer] Patient Information 2014 [employer].

## 2013-04-12 ENCOUNTER — Encounter: Payer: Self-pay | Admitting: Advanced Practice Midwife

## 2013-04-18 ENCOUNTER — Ambulatory Visit (INDEPENDENT_AMBULATORY_CARE_PROVIDER_SITE_OTHER): Payer: Medicaid Other | Admitting: Advanced Practice Midwife

## 2013-04-18 VITALS — BP 102/68 | Temp 98.7°F | Wt 234.2 lb

## 2013-04-18 DIAGNOSIS — Z3483 Encounter for supervision of other normal pregnancy, third trimester: Secondary | ICD-10-CM

## 2013-04-18 DIAGNOSIS — E669 Obesity, unspecified: Secondary | ICD-10-CM

## 2013-04-18 LAB — POCT URINALYSIS DIP (DEVICE)
Bilirubin Urine: NEGATIVE
Ketones, ur: NEGATIVE mg/dL
Protein, ur: NEGATIVE mg/dL
Specific Gravity, Urine: 1.02 (ref 1.005–1.030)
pH: 7.5 (ref 5.0–8.0)

## 2013-04-18 NOTE — Progress Notes (Signed)
p=106 

## 2013-04-18 NOTE — Progress Notes (Signed)
Doing well.  Good fetal movement, denies vaginal bleeding, LOF, regular contractions.  Feels like she needs to urinate sometimes when she doesn't, no discomfort.  Discussed baby's position related to bladder, recommend letting us know if symptoms worsen.  Normal U/A today.

## 2013-04-27 NOTE — L&D Delivery Note (Signed)
Delivery Note At 3:17 PM a healthy female was delivered via Vaginal, Spontaneous Delivery (Presentation: Left Occiput Anterior).  APGAR: 8 & 9; weight .   Placenta status: Intact, Spontaneous.  Cord: 3 vessels with the following complications: None.  Cord pH: obtained and pending. NICU present for meconium stained fluid.  Anesthesia:  Epidural Episiotomy: None Lacerations: Periurethral Suture Repair: NA Est. Blood Loss (300 mL):   Mom to postpartum.  Baby to Couplet care / Skin to Skin.  Phill Myron 06/21/2013, 3:31 PM

## 2013-04-27 NOTE — L&D Delivery Note (Signed)
Attestation of Attending Supervision of Resident: Evaluation and management procedures were performed by the [facility] Medicine Resident under my supervision.  I have seen and examined the patient, reviewed the resident's note and chart, and I agree with the management and plan.  Lasandra Beech, M.D. 06/21/2013 4:56 PM

## 2013-05-02 ENCOUNTER — Encounter: Payer: Medicaid Other | Admitting: Advanced Practice Midwife

## 2013-05-03 ENCOUNTER — Encounter: Payer: Self-pay | Admitting: *Deleted

## 2013-05-09 ENCOUNTER — Ambulatory Visit (INDEPENDENT_AMBULATORY_CARE_PROVIDER_SITE_OTHER): Payer: Medicaid Other | Admitting: Family

## 2013-05-09 VITALS — BP 114/69 | Temp 98.8°F | Wt 238.6 lb

## 2013-05-09 DIAGNOSIS — Z3480 Encounter for supervision of other normal pregnancy, unspecified trimester: Secondary | ICD-10-CM

## 2013-05-09 DIAGNOSIS — IMO0002 Reserved for concepts with insufficient information to code with codable children: Secondary | ICD-10-CM

## 2013-05-09 LAB — POCT URINALYSIS DIP (DEVICE)
Bilirubin Urine: NEGATIVE
GLUCOSE, UA: NEGATIVE mg/dL
Hgb urine dipstick: NEGATIVE
Ketones, ur: NEGATIVE mg/dL
NITRITE: NEGATIVE
Protein, ur: NEGATIVE mg/dL
Specific Gravity, Urine: 1.01 (ref 1.005–1.030)
UROBILINOGEN UA: 0.2 mg/dL (ref 0.0–1.0)
pH: 6 (ref 5.0–8.0)

## 2013-05-09 NOTE — Progress Notes (Signed)
P= 89 C/o of mild intermittent lower abdominal/pelvic pressure.

## 2013-05-09 NOTE — Progress Notes (Signed)
Doing well; feeling pressure, occasional contraction 1-2/day.  No bleeding or leaking of fluid.

## 2013-05-23 ENCOUNTER — Ambulatory Visit (INDEPENDENT_AMBULATORY_CARE_PROVIDER_SITE_OTHER): Payer: Medicaid Other | Admitting: Family

## 2013-05-23 VITALS — BP 109/51 | Temp 97.0°F | Wt 239.9 lb

## 2013-05-23 DIAGNOSIS — IMO0002 Reserved for concepts with insufficient information to code with codable children: Secondary | ICD-10-CM

## 2013-05-23 DIAGNOSIS — Z3480 Encounter for supervision of other normal pregnancy, unspecified trimester: Secondary | ICD-10-CM

## 2013-05-23 LAB — POCT URINALYSIS DIP (DEVICE)
BILIRUBIN URINE: NEGATIVE
GLUCOSE, UA: NEGATIVE mg/dL
Ketones, ur: NEGATIVE mg/dL
NITRITE: NEGATIVE
Protein, ur: 100 mg/dL — AB
Specific Gravity, Urine: >=1.03 (ref 1.005–1.030)
Urobilinogen, UA: 0.2 mg/dL (ref 0.0–1.0)
pH: 6 (ref 5.0–8.0)

## 2013-05-23 NOTE — Progress Notes (Signed)
Reports pelvic pressure, no consistent contractions.  Declined cervical check.  Denies UTI symptoms, urine +leuks, 2+protein > urine culture sent.  GBS at next visit.

## 2013-05-23 NOTE — Progress Notes (Signed)
P=88  Pt reports pelvic pressure

## 2013-05-25 ENCOUNTER — Encounter (HOSPITAL_COMMUNITY): Payer: Self-pay | Admitting: *Deleted

## 2013-05-25 ENCOUNTER — Inpatient Hospital Stay (HOSPITAL_COMMUNITY)
Admission: AD | Admit: 2013-05-25 | Discharge: 2013-05-25 | Disposition: A | Discharge status: Home / Self Care | Payer: Medicaid Other | Source: Ambulatory Visit | Attending: Obstetrics & Gynecology | Admitting: Obstetrics & Gynecology

## 2013-05-25 DIAGNOSIS — R109 Unspecified abdominal pain: Secondary | ICD-10-CM | POA: Insufficient documentation

## 2013-05-25 DIAGNOSIS — O4700 False labor before 37 completed weeks of gestation, unspecified trimester: Secondary | ICD-10-CM | POA: Insufficient documentation

## 2013-05-25 DIAGNOSIS — O26893 Other specified pregnancy related conditions, third trimester: Secondary | ICD-10-CM

## 2013-05-25 DIAGNOSIS — N898 Other specified noninflammatory disorders of vagina: Secondary | ICD-10-CM

## 2013-05-25 DIAGNOSIS — R519 Headache, unspecified: Secondary | ICD-10-CM

## 2013-05-25 DIAGNOSIS — R51 Headache: Secondary | ICD-10-CM

## 2013-05-25 DIAGNOSIS — O9989 Other specified diseases and conditions complicating pregnancy, childbirth and the puerperium: Secondary | ICD-10-CM

## 2013-05-25 DIAGNOSIS — Z87891 Personal history of nicotine dependence: Secondary | ICD-10-CM | POA: Insufficient documentation

## 2013-05-25 LAB — CULTURE, OB URINE

## 2013-05-25 LAB — URINALYSIS, ROUTINE W REFLEX MICROSCOPIC
Bilirubin Urine: NEGATIVE
Glucose, UA: NEGATIVE mg/dL
Hgb urine dipstick: NEGATIVE
Ketones, ur: NEGATIVE mg/dL
Nitrite: NEGATIVE
PH: 6 (ref 5.0–8.0)
Protein, ur: NEGATIVE mg/dL
SPECIFIC GRAVITY, URINE: 1.01 (ref 1.005–1.030)
UROBILINOGEN UA: 0.2 mg/dL (ref 0.0–1.0)

## 2013-05-25 LAB — WET PREP, GENITAL
Clue Cells Wet Prep HPF POC: NONE SEEN
Trich, Wet Prep: NONE SEEN
Yeast Wet Prep HPF POC: NONE SEEN

## 2013-05-25 LAB — AMNISURE RUPTURE OF MEMBRANE (ROM) NOT AT ARMC: Amnisure ROM: NEGATIVE

## 2013-05-25 LAB — POCT FERN TEST: POCT Fern Test: NEGATIVE

## 2013-05-25 LAB — URINE MICROSCOPIC-ADD ON

## 2013-05-25 NOTE — MAU Provider Note (Signed)
First Provider Initiated Contact with Patient 05/25/13 1321      Chief Complaint:  Labor Eval   Victoria Trujillo is  28 y.o. G3P1011 at [redacted]w[redacted]d presents complaining of Labor Eval . Pt complaints of irregular lower abdominal cramping that started this morning. Pt was at work when she felt a gush of fluid and reports having persistent vaginal discharge vs leakage of fluid since onset. No VB, and normal fetal movement. Pt otherwise is feeling well.  Obstetrical/Gynecological History: OB History   Grav Para Term Preterm Abortions TAB SAB Ect Mult Living   3 1 1  0 1 1 0 0 0 1     Past Medical History: Past Medical History  Diagnosis Date  . Infection     UTI  . Ovarian cyst   . [id number](875.6)     Past Surgical History: Past Surgical History  Procedure Laterality Date  . Therapeutic abortion    . Wisdom tooth extraction      Family History: Family History  Problem Relation Age of Onset  . Hypertension Mother   . Diabetes Father   . Hypertension Father   . Heart disease Father   . Stroke Father   . Hypertension Maternal Grandmother   . Cancer Maternal Grandmother   . Kidney disease Maternal Grandfather     Social History: History  Substance Use Topics  . Smoking status: Former Research scientist (life sciences)  . Smokeless tobacco: Never Used     Comment: not daily  . Alcohol Use: No    Allergies: No Known Allergies  Meds:  Facility-administered medications prior to admission  Medication Dose Route Frequency Provider Last Rate Last Dose  . Tdap (BOOSTRIX) injection 0.5 mL  0.5 mL Intramuscular Once [place], CNM       Prescriptions prior to admission  Medication Sig Dispense Refill  . acetaminophen (TYLENOL) 500 MG tablet Take 500 mg by mouth every 6 (six) hours as needed for pain.      . butalbital-aspirin-caffeine (BUTALBITAL COMPOUND/ASA) 50-325-40 MG per tablet Take 1 tablet by mouth 2 (two) times daily as needed for headache.  14 tablet  1  . Prenatal Vit-Fe Fumarate-FA  (PRENATAL MULTIVITAMIN) TABS tablet Take 1 tablet by mouth daily at 12 noon.        Review of Systems -   Review of Systems  No ha, vision chagnes, cp, sob, n/v, d/c, or other compalints at this time. No urinary sympomts   Physical Exam  Blood pressure 124/72, pulse 91, temperature 98.2 F (36.8 C), temperature source Oral, resp. rate 18, height 5\' 4"  (1.626 m), weight 108.41 kg (239 lb), last menstrual period 09/17/2012. GENERAL: Well-developed, well-nourished female in no acute distress.  ABDOMEN: Soft, nontender, nondistended, gravid.  EXTREMITIES: Nontender, no edema, 2+ distal pulses. Dilation: Closed Effacement (%): Thick Station:  (HIGH) Exam by:: DR Jarielys Girardot External os open to 2cm SSE: white discharge no pooling. Neg Ferning  FHT:  Baseline rate 140 bpm   Variability moderate  Accelerations present   Decelerations none Contractions: Every 10-15 mins with uterine irritability  Results for Victoria Trujillo, Victoria Trujillo (MRN [id number]) as of 05/25/2013 14:07  Ref. Range 05/25/2013 13:15 05/25/2013 13:35 05/25/2013 13:47  Yeast Wet Prep HPF POC Latest Range: NONE SEEN  NONE SEEN    Trich, Wet Prep Latest Range: NONE SEEN  NONE SEEN    Clue Cells Wet Prep HPF POC Latest Range: NONE SEEN  NONE SEEN    WBC, Wet Prep HPF POC Latest Range: NONE SEEN  MODERATE (  A)    Color, Urine Latest Range: YELLOW   YELLOW   APPearance Latest Range: CLEAR   CLEAR   Specific Gravity, Urine Latest Range: 1.005-1.030   1.010   pH Latest Range: 5.0-8.0   6.0   Glucose Latest Range: NEGATIVE mg/dL  NEGATIVE   Bilirubin Urine Latest Range: NEGATIVE   NEGATIVE   Ketones, ur Latest Range: NEGATIVE mg/dL  NEGATIVE   Protein Latest Range: NEGATIVE mg/dL  NEGATIVE   Urobilinogen, UA Latest Range: 0.0-1.0 mg/dL  0.2   Nitrite Latest Range: NEGATIVE   NEGATIVE   Leukocytes, UA Latest Range: NEGATIVE   TRACE (A)   Hgb urine dipstick Latest Range: NEGATIVE   NEGATIVE   WBC, UA Latest Range: <3 WBC/hpf  3-6   Squamous  Epithelial / LPF Latest Range: RARE   FEW (A)   Amnisure ROM No range found  NEGATIVE   WET PREP, GENITAL No range found Rpt (A)    POCT Fern Test No range found   Negative = intact amniotic membranes     Labs: No results found for this or any previous visit (from the past 24 hour(s)). Imaging Studies:  No results found.  Assessment: Victoria Trujillo is  28 y.o. G3P1011 at [redacted]w[redacted]d presents with evaluation for ROM and labor. Pt cervical exam, neg ferning and neg amniosure reassuring. Pt discharged home with PTL precuations    Victoria Trujillo 1/29/20151:24 PM

## 2013-05-25 NOTE — Discharge Instructions (Signed)
Third Trimester of Pregnancy °The third trimester is from week 29 through week 42, months 7 through 9. The third trimester is a time when the fetus is growing rapidly. At the end of the ninth month, the fetus is about 20 inches in length and weighs 6 10 pounds.  °BODY CHANGES °Your body goes through many changes during pregnancy. The changes vary from woman to woman.  °· Your weight will continue to increase. You can expect to gain 25 35 pounds (11 16 kg) by the end of the pregnancy. °· You may begin to get stretch marks on your hips, abdomen, and breasts. °· You may urinate more often because the fetus is moving lower into your pelvis and pressing on your bladder. °· You may develop or continue to have heartburn as a result of your pregnancy. °· You may develop constipation because certain hormones are causing the muscles that push waste through your intestines to slow down. °· You may develop hemorrhoids or swollen, bulging veins (varicose veins). °· You may have pelvic pain because of the weight gain and pregnancy hormones relaxing your joints between the bones in your pelvis. Back aches may result from over exertion of the muscles supporting your posture. °· Your breasts will continue to grow and be tender. A yellow discharge may leak from your breasts called colostrum. °· Your belly button may stick out. °· You may feel short of breath because of your expanding uterus. °· You may notice the fetus "dropping," or moving lower in your abdomen. °· You may have a bloody mucus discharge. This usually occurs a few days to a week before labor begins. °· Your cervix becomes thin and soft (effaced) near your due date. °WHAT TO EXPECT AT YOUR PRENATAL EXAMS  °You will have prenatal exams every 2 weeks until week 36. Then, you will have weekly prenatal exams. During a routine prenatal visit: °· You will be weighed to make sure you and the fetus are growing normally. °· Your blood pressure is taken. °· Your abdomen will be  measured to track your baby's growth. °· The fetal heartbeat will be listened to. °· Any test results from the previous visit will be discussed. °· You may have a cervical check near your due date to see if you have effaced. °At around 36 weeks, your caregiver will check your cervix. At the same time, your caregiver will also perform a test on the secretions of the vaginal tissue. This test is to determine if a type of bacteria, Group B streptococcus, is present. Your caregiver will explain this further. °Your caregiver may ask you: °· What your birth plan is. °· How you are feeling. °· If you are feeling the baby move. °· If you have had any abnormal symptoms, such as leaking fluid, bleeding, severe headaches, or abdominal cramping. °· If you have any questions. °Other tests or screenings that may be performed during your third trimester include: °· Blood tests that check for low iron levels (anemia). °· Fetal testing to check the health, activity level, and growth of the fetus. Testing is done if you have certain medical conditions or if there are problems during the pregnancy. °FALSE LABOR °You may feel small, irregular contractions that eventually go away. These are called Braxton Hicks contractions, or false labor. Contractions may last for hours, days, or even weeks before true labor sets in. If contractions come at regular intervals, intensify, or become painful, it is best to be seen by your caregiver.  °  SIGNS OF LABOR  °· Menstrual-like cramps. °· Contractions that are 5 minutes apart or less. °· Contractions that start on the top of the uterus and spread down to the lower abdomen and back. °· A sense of increased pelvic pressure or back pain. °· A watery or bloody mucus discharge that comes from the vagina. °If you have any of these signs before the 37th week of pregnancy, call your caregiver right away. You need to go to the hospital to get checked immediately. °HOME CARE INSTRUCTIONS  °· Avoid all  smoking, herbs, alcohol, and unprescribed drugs. These chemicals affect the formation and growth of the baby. °· Follow your caregiver's instructions regarding medicine use. There are medicines that are either safe or unsafe to take during pregnancy. °· Exercise only as directed by your caregiver. Experiencing uterine cramps is a good sign to stop exercising. °· Continue to eat regular, healthy meals. °· Wear a good support bra for breast tenderness. °· Do not use hot tubs, steam rooms, or saunas. °· Wear your seat belt at all times when driving. °· Avoid raw meat, uncooked cheese, cat litter boxes, and soil used by cats. These carry germs that can cause birth defects in the baby. °· Take your prenatal vitamins. °· Try taking a stool softener (if your caregiver approves) if you develop constipation. Eat more high-fiber foods, such as fresh vegetables or fruit and whole grains. Drink plenty of fluids to keep your urine clear or pale yellow. °· Take warm sitz baths to soothe any pain or discomfort caused by hemorrhoids. Use hemorrhoid cream if your caregiver approves. °· If you develop varicose veins, wear support hose. Elevate your feet for 15 minutes, 3 4 times a day. Limit salt in your diet. °· Avoid heavy lifting, wear low heal shoes, and practice good posture. °· Rest a lot with your legs elevated if you have leg cramps or low back pain. °· Visit your dentist if you have not gone during your pregnancy. Use a soft toothbrush to brush your teeth and be gentle when you floss. °· A sexual relationship may be continued unless your caregiver directs you otherwise. °· Do not travel far distances unless it is absolutely necessary and only with the approval of your caregiver. °· Take prenatal classes to understand, practice, and ask questions about the labor and delivery. °· Make a trial run to the hospital. °· Pack your hospital bag. °· Prepare the baby's nursery. °· Continue to go to all your prenatal visits as directed  by your caregiver. °SEEK MEDICAL CARE IF: °· You are unsure if you are in labor or if your water has broken. °· You have dizziness. °· You have mild pelvic cramps, pelvic pressure, or nagging pain in your abdominal area. °· You have persistent nausea, vomiting, or diarrhea. °· You have a bad smelling vaginal discharge. °· You have pain with urination. °SEEK IMMEDIATE MEDICAL CARE IF:  °· You have a fever. °· You are leaking fluid from your vagina. °· You have spotting or bleeding from your vagina. °· You have severe abdominal cramping or pain. °· You have rapid weight loss or gain. °· You have shortness of breath with chest pain. °· You notice sudden or extreme swelling of your face, hands, ankles, feet, or legs. °· You have not felt your baby move in over an hour. °· You have severe headaches that do not go away with medicine. °· You have vision changes. °Document Released: 04/07/2001 Document Revised: 12/14/2012 Document Reviewed:   You have severe abdominal cramping or pain.   You have rapid weight loss or gain.   You have shortness of breath with chest pain.   You notice sudden or extreme swelling of your face, hands, ankles, feet, or legs.   You have not felt your baby move in over an hour.   You have severe headaches that do not go away with medicine.   You have vision changes.  Document Released: 04/07/2001 Document Revised: 12/14/2012 Document Reviewed: 06/14/2012  [employer] Patient Information 2014 [employer].

## 2013-05-25 NOTE — MAU Note (Addendum)
~  47min ago ? Leaking of clear fluid, still seems to be coming.  Having cramping in abd, was sharp and more intense initially also having  pelvic pressure

## 2013-06-01 ENCOUNTER — Ambulatory Visit (INDEPENDENT_AMBULATORY_CARE_PROVIDER_SITE_OTHER): Payer: Medicaid Other | Admitting: Family Medicine

## 2013-06-01 ENCOUNTER — Encounter: Payer: Self-pay | Admitting: Family Medicine

## 2013-06-01 VITALS — BP 104/67 | Temp 97.9°F | Wt 241.9 lb

## 2013-06-01 DIAGNOSIS — Z3480 Encounter for supervision of other normal pregnancy, unspecified trimester: Secondary | ICD-10-CM

## 2013-06-01 DIAGNOSIS — IMO0002 Reserved for concepts with insufficient information to code with codable children: Secondary | ICD-10-CM

## 2013-06-01 LAB — POCT URINALYSIS DIP (DEVICE)
Bilirubin Urine: NEGATIVE
GLUCOSE, UA: NEGATIVE mg/dL
KETONES UR: NEGATIVE mg/dL
Nitrite: NEGATIVE
Protein, ur: NEGATIVE mg/dL
Specific Gravity, Urine: 1.015 (ref 1.005–1.030)
Urobilinogen, UA: 0.2 mg/dL (ref 0.0–1.0)
pH: 7 (ref 5.0–8.0)

## 2013-06-01 LAB — OB RESULTS CONSOLE GC/CHLAMYDIA
Chlamydia: NEGATIVE
Gonorrhea: NEGATIVE

## 2013-06-01 NOTE — Progress Notes (Signed)
+  FM, no lof, no vb, rare ctx  Victoria Trujillo is a 28 y.o. G3P1011 at [redacted]w[redacted]d by L=12 here for ROB visit.  Discussed with Patient:  -Plans to breast feed.  All questions answered. -Continue prenatal vitamins. -Reviewed fetal kick counts Pt to perform daily at a time when the baby is active, lie laterally with both hands on belly in quiet room and count all movements (hiccups, shoulder rolls, obvious kicks, etc); pt is to report to clinic L&D for less than 10 movements felt in a one hour time period-pt told as soon as she counts 10 movements the count is complete.  - Routine precautions discussed (depression, infection s/s).   Patient provided with all pertinent phone numbers for emergencies. - RTC for any VB, regular, painful cramps/ctxs occurring at a rate of >2/10 min, fever (100.5 or higher), n/v/d, any pain that is unresolving or worsening, LOF, decreased fetal movement, CP, SOB, edema - RTC in 1 weeks for next appt. - Did GBS swabs today and will f/u results and call if abnormal.    Problems: Patient Active Problem List   Diagnosis Date Noted  . Right ovarian cyst 02/22/2013  . Edema or excessive weight gain, antepartum 12/02/2012  . Supervision of normal subsequent pregnancy 11/02/2012    To Do: 1. GBS today  [ ]  Vaccines: recd [ x] BCM: mirena [ x] Readiness: baby has a place to sleep, car seat, other baby necessities.  Edu: [x ] PTL precautions; [ ]  BF class; [ ]  childbirth class; [ ]   BF counseling;

## 2013-06-01 NOTE — Progress Notes (Signed)
P=93 

## 2013-06-01 NOTE — Addendum Note (Signed)
Addended by: Ernie Avena on: 06/01/2013 04:17 PM   Modules accepted: Orders

## 2013-06-01 NOTE — Patient Instructions (Signed)
Third Trimester of Pregnancy The third trimester is from week 29 through week 42, months 7 through 9. The third trimester is a time when the fetus is growing rapidly. At the end of the ninth month, the fetus is about 20 inches in length and weighs 6 10 pounds.  BODY CHANGES Your body goes through many changes during pregnancy. The changes vary from woman to woman.   Your weight will continue to increase. You can expect to gain 25 35 pounds (11 16 kg) by the end of the pregnancy.  You may begin to get stretch marks on your hips, abdomen, and breasts.  You may urinate more often because the fetus is moving lower into your pelvis and pressing on your bladder.  You may develop or continue to have heartburn as a result of your pregnancy.  You may develop constipation because certain hormones are causing the muscles that push waste through your intestines to slow down.  You may develop hemorrhoids or swollen, bulging veins (varicose veins).  You may have pelvic pain because of the weight gain and pregnancy hormones relaxing your joints between the bones in your pelvis. Back aches may result from over exertion of the muscles supporting your posture.  Your breasts will continue to grow and be tender. A yellow discharge may leak from your breasts called colostrum.  Your belly button may stick out.  You may feel short of breath because of your expanding uterus.  You may notice the fetus "dropping," or moving lower in your abdomen.  You may have a bloody mucus discharge. This usually occurs a few days to a week before labor begins.  Your cervix becomes thin and soft (effaced) near your due date. WHAT TO EXPECT AT YOUR PRENATAL EXAMS  You will have prenatal exams every 2 weeks until week 36. Then, you will have weekly prenatal exams. During a routine prenatal visit:  You will be weighed to make sure you and the fetus are growing normally.  Your blood pressure is taken.  Your abdomen will be  measured to track your baby's growth.  The fetal heartbeat will be listened to.  Any test results from the previous visit will be discussed.  You may have a cervical check near your due date to see if you have effaced. At around 36 weeks, your caregiver will check your cervix. At the same time, your caregiver will also perform a test on the secretions of the vaginal tissue. This test is to determine if a type of bacteria, Group B streptococcus, is present. Your caregiver will explain this further. Your caregiver may ask you:  What your birth plan is.  How you are feeling.  If you are feeling the baby move.  If you have had any abnormal symptoms, such as leaking fluid, bleeding, severe headaches, or abdominal cramping.  If you have any questions. Other tests or screenings that may be performed during your third trimester include:  Blood tests that check for low iron levels (anemia).  Fetal testing to check the health, activity level, and growth of the fetus. Testing is done if you have certain medical conditions or if there are problems during the pregnancy. FALSE LABOR You may feel small, irregular contractions that eventually go away. These are called Braxton Hicks contractions, or false labor. Contractions may last for hours, days, or even weeks before true labor sets in. If contractions come at regular intervals, intensify, or become painful, it is best to be seen by your caregiver.  SIGNS OF LABOR   Menstrual-like cramps.  Contractions that are 5 minutes apart or less.  Contractions that start on the top of the uterus and spread down to the lower abdomen and back.  A sense of increased pelvic pressure or back pain.  A watery or bloody mucus discharge that comes from the vagina. If you have any of these signs before the 37th week of pregnancy, call your caregiver right away. You need to go to the hospital to get checked immediately. HOME CARE INSTRUCTIONS   Avoid all  smoking, herbs, alcohol, and unprescribed drugs. These chemicals affect the formation and growth of the baby.  Follow your caregiver's instructions regarding medicine use. There are medicines that are either safe or unsafe to take during pregnancy.  Exercise only as directed by your caregiver. Experiencing uterine cramps is a good sign to stop exercising.  Continue to eat regular, healthy meals.  Wear a good support bra for breast tenderness.  Do not use hot tubs, steam rooms, or saunas.  Wear your seat belt at all times when driving.  Avoid raw meat, uncooked cheese, cat litter boxes, and soil used by cats. These carry germs that can cause birth defects in the baby.  Take your prenatal vitamins.  Try taking a stool softener (if your caregiver approves) if you develop constipation. Eat more high-fiber foods, such as fresh vegetables or fruit and whole grains. Drink plenty of fluids to keep your urine clear or pale yellow.  Take warm sitz baths to soothe any pain or discomfort caused by hemorrhoids. Use hemorrhoid cream if your caregiver approves.  If you develop varicose veins, wear support hose. Elevate your feet for 15 minutes, 3 4 times a day. Limit salt in your diet.  Avoid heavy lifting, wear low heal shoes, and practice good posture.  Rest a lot with your legs elevated if you have leg cramps or low back pain.  Visit your dentist if you have not gone during your pregnancy. Use a soft toothbrush to brush your teeth and be gentle when you floss.  A sexual relationship may be continued unless your caregiver directs you otherwise.  Do not travel far distances unless it is absolutely necessary and only with the approval of your caregiver.  Take prenatal classes to understand, practice, and ask questions about the labor and delivery.  Make a trial run to the hospital.  Pack your hospital bag.  Prepare the baby's nursery.  Continue to go to all your prenatal visits as directed  by your caregiver. SEEK MEDICAL CARE IF:  You are unsure if you are in labor or if your water has broken.  You have dizziness.  You have mild pelvic cramps, pelvic pressure, or nagging pain in your abdominal area.  You have persistent nausea, vomiting, or diarrhea.  You have a bad smelling vaginal discharge.  You have pain with urination. SEEK IMMEDIATE MEDICAL CARE IF:   You have a fever.  You are leaking fluid from your vagina.  You have spotting or bleeding from your vagina.  You have severe abdominal cramping or pain.  You have rapid weight loss or gain.  You have shortness of breath with chest pain.  You notice sudden or extreme swelling of your face, hands, ankles, feet, or legs.  You have not felt your baby move in over an hour.  You have severe headaches that do not go away with medicine.  You have vision changes. Document Released: 04/07/2001 Document Revised: 12/14/2012 Document Reviewed:   You have severe abdominal cramping or pain.   You have rapid weight loss or gain.   You have shortness of breath with chest pain.   You notice sudden or extreme swelling of your face, hands, ankles, feet, or legs.   You have not felt your baby move in over an hour.   You have severe headaches that do not go away with medicine.   You have vision changes.  Document Released: 04/07/2001 Document Revised: 12/14/2012 Document Reviewed: 06/14/2012  ExitCare Patient Information 2014 [employer].

## 2013-06-02 ENCOUNTER — Other Ambulatory Visit: Payer: Self-pay | Admitting: Family Medicine

## 2013-06-02 DIAGNOSIS — R51 Headache: Principal | ICD-10-CM

## 2013-06-02 DIAGNOSIS — R519 Headache, unspecified: Secondary | ICD-10-CM

## 2013-06-02 LAB — GC/CHLAMYDIA PROBE AMP
CT PROBE, AMP APTIMA: NEGATIVE
GC PROBE AMP APTIMA: NEGATIVE

## 2013-06-03 LAB — CULTURE, BETA STREP (GROUP B ONLY)

## 2013-06-05 MED ORDER — BUTALBITAL-ASPIRIN-CAFFEINE 50-325-40 MG PO TABS: 1.0000 | ORAL_TABLET | Freq: Two times a day (BID) | ORAL | Status: DC | PRN

## 2013-06-05 NOTE — Telephone Encounter (Signed)
Left message that prescription is ordered and waiting at her pharmacy.

## 2013-06-06 ENCOUNTER — Encounter: Payer: Self-pay | Admitting: Family Medicine

## 2013-06-06 ENCOUNTER — Encounter: Payer: Self-pay | Admitting: *Deleted

## 2013-06-07 ENCOUNTER — Encounter: Payer: Self-pay | Admitting: Obstetrics & Gynecology

## 2013-06-12 ENCOUNTER — Ambulatory Visit (INDEPENDENT_AMBULATORY_CARE_PROVIDER_SITE_OTHER): Payer: Medicaid Other | Admitting: Obstetrics & Gynecology

## 2013-06-12 VITALS — BP 128/76 | Temp 97.7°F | Wt 243.5 lb

## 2013-06-12 DIAGNOSIS — IMO0002 Reserved for concepts with insufficient information to code with codable children: Secondary | ICD-10-CM

## 2013-06-12 DIAGNOSIS — Z3480 Encounter for supervision of other normal pregnancy, unspecified trimester: Secondary | ICD-10-CM

## 2013-06-12 LAB — POCT URINALYSIS DIP (DEVICE)
Bilirubin Urine: NEGATIVE
GLUCOSE, UA: NEGATIVE mg/dL
Ketones, ur: 40 mg/dL — AB
NITRITE: NEGATIVE
Protein, ur: NEGATIVE mg/dL
Specific Gravity, Urine: 1.025 (ref 1.005–1.030)
UROBILINOGEN UA: 0.2 mg/dL (ref 0.0–1.0)
pH: 6.5 (ref 5.0–8.0)

## 2013-06-12 NOTE — Progress Notes (Signed)
Pulse- 87  Patient reports pelvic pain/pressure and occasional contractions

## 2013-06-12 NOTE — Progress Notes (Signed)
Cervix checked 2/50/-2/soft/post/vertex.  No other complaints or concerns.  Fetal movement and labor precautions reviewed.

## 2013-06-12 NOTE — Patient Instructions (Signed)
Return to clinic for any obstetric concerns or go to [facility] for evaluation  

## 2013-06-19 ENCOUNTER — Ambulatory Visit (INDEPENDENT_AMBULATORY_CARE_PROVIDER_SITE_OTHER): Payer: Medicaid Other | Admitting: Obstetrics & Gynecology

## 2013-06-19 ENCOUNTER — Encounter: Payer: Self-pay | Admitting: Obstetrics & Gynecology

## 2013-06-19 VITALS — BP 121/75 | Temp 97.6°F | Wt 240.1 lb

## 2013-06-19 DIAGNOSIS — IMO0002 Reserved for concepts with insufficient information to code with codable children: Secondary | ICD-10-CM

## 2013-06-19 DIAGNOSIS — Z3480 Encounter for supervision of other normal pregnancy, unspecified trimester: Secondary | ICD-10-CM

## 2013-06-19 LAB — POCT URINALYSIS DIP (DEVICE)
BILIRUBIN URINE: NEGATIVE
Glucose, UA: NEGATIVE mg/dL
Hgb urine dipstick: NEGATIVE
Ketones, ur: NEGATIVE mg/dL
LEUKOCYTES UA: NEGATIVE
Nitrite: NEGATIVE
Protein, ur: NEGATIVE mg/dL
Specific Gravity, Urine: 1.01 (ref 1.005–1.030)
UROBILINOGEN UA: 0.2 mg/dL (ref 0.0–1.0)
pH: 6.5 (ref 5.0–8.0)

## 2013-06-19 NOTE — Progress Notes (Signed)
Few [place], precautions given

## 2013-06-19 NOTE — Progress Notes (Signed)
p=100

## 2013-06-19 NOTE — Patient Instructions (Signed)

## 2013-06-21 ENCOUNTER — Encounter (HOSPITAL_COMMUNITY): Payer: Medicaid Other | Admitting: Anesthesiology

## 2013-06-21 ENCOUNTER — Encounter (HOSPITAL_COMMUNITY): Payer: Self-pay

## 2013-06-21 ENCOUNTER — Encounter: Payer: Self-pay | Admitting: *Deleted

## 2013-06-21 ENCOUNTER — Inpatient Hospital Stay (HOSPITAL_COMMUNITY): Payer: Medicaid Other | Admitting: Anesthesiology

## 2013-06-21 ENCOUNTER — Inpatient Hospital Stay (HOSPITAL_COMMUNITY)
Admission: AD | Admit: 2013-06-21 | Discharge: 2013-06-22 | DRG: 775 | Disposition: A | Discharge status: Home / Self Care | Payer: Medicaid Other | Source: Ambulatory Visit | Attending: Obstetrics & Gynecology | Admitting: Obstetrics & Gynecology

## 2013-06-21 DIAGNOSIS — Z87891 Personal history of nicotine dependence: Secondary | ICD-10-CM

## 2013-06-21 DIAGNOSIS — O139 Gestational [pregnancy-induced] hypertension without significant proteinuria, unspecified trimester: Secondary | ICD-10-CM

## 2013-06-21 DIAGNOSIS — IMO0001 Reserved for inherently not codable concepts without codable children: Secondary | ICD-10-CM

## 2013-06-21 HISTORY — DX: Gestational (pregnancy-induced) hypertension without significant proteinuria, unspecified trimester: O13.9

## 2013-06-21 LAB — TYPE AND SCREEN
ABO/RH(D): A POS
ANTIBODY SCREEN: NEGATIVE

## 2013-06-21 LAB — URINE MICROSCOPIC-ADD ON

## 2013-06-21 LAB — COMPREHENSIVE METABOLIC PANEL
ALT: 18 U/L (ref 0–35)
AST: 22 U/L (ref 0–37)
Albumin: 3.1 g/dL — ABNORMAL LOW (ref 3.5–5.2)
Alkaline Phosphatase: 165 U/L — ABNORMAL HIGH (ref 39–117)
BUN: 6 mg/dL (ref 6–23)
CALCIUM: 9.6 mg/dL (ref 8.4–10.5)
CO2: 23 mEq/L (ref 19–32)
CREATININE: 0.58 mg/dL (ref 0.50–1.10)
Chloride: 101 mEq/L (ref 96–112)
GLUCOSE: 101 mg/dL — AB (ref 70–99)
Potassium: 3.7 mEq/L (ref 3.7–5.3)
SODIUM: 138 meq/L (ref 137–147)
Total Bilirubin: 0.3 mg/dL (ref 0.3–1.2)
Total Protein: 7.8 g/dL (ref 6.0–8.3)

## 2013-06-21 LAB — URINALYSIS, ROUTINE W REFLEX MICROSCOPIC
Bilirubin Urine: NEGATIVE
Glucose, UA: NEGATIVE mg/dL
Ketones, ur: NEGATIVE mg/dL
Nitrite: NEGATIVE
PROTEIN: NEGATIVE mg/dL
Specific Gravity, Urine: 1.01 (ref 1.005–1.030)
UROBILINOGEN UA: 0.2 mg/dL (ref 0.0–1.0)
pH: 6.5 (ref 5.0–8.0)

## 2013-06-21 LAB — CBC
HCT: 41.5 % (ref 36.0–46.0)
HEMOGLOBIN: 14.1 g/dL (ref 12.0–15.0)
MCH: 29.6 pg (ref 26.0–34.0)
MCHC: 34 g/dL (ref 30.0–36.0)
MCV: 87.2 fL (ref 78.0–100.0)
Platelets: 255 10*3/uL (ref 150–400)
RBC: 4.76 MIL/uL (ref 3.87–5.11)
RDW: 14.2 % (ref 11.5–15.5)
WBC: 9.8 10*3/uL (ref 4.0–10.5)

## 2013-06-21 LAB — PROTEIN / CREATININE RATIO, URINE
CREATININE, URINE: 54.55 mg/dL
PROTEIN CREATININE RATIO: 0.19 — AB (ref 0.00–0.15)
TOTAL PROTEIN, URINE: 10.4 mg/dL

## 2013-06-21 LAB — OB RESULTS CONSOLE GBS: GBS: NEGATIVE

## 2013-06-21 LAB — RPR: RPR: NONREACTIVE

## 2013-06-21 MED ORDER — EPHEDRINE 5 MG/ML INJ
10.0000 mg | INTRAVENOUS | Status: DC | PRN
  Filled 2013-06-21: qty 2

## 2013-06-21 MED ORDER — DIBUCAINE 1 % RE OINT: 1.0000 "application " | TOPICAL_OINTMENT | RECTAL | Status: DC | PRN

## 2013-06-21 MED ORDER — PHENYLEPHRINE 40 MCG/ML (10ML) SYRINGE FOR IV PUSH (FOR BLOOD PRESSURE SUPPORT)
80.0000 ug | PREFILLED_SYRINGE | INTRAVENOUS | Status: DC | PRN
  Filled 2013-06-21: qty 2

## 2013-06-21 MED ORDER — DIPHENHYDRAMINE HCL 25 MG PO CAPS: 25.0000 mg | ORAL_CAPSULE | Freq: Four times a day (QID) | ORAL | Status: DC | PRN

## 2013-06-21 MED ORDER — PHENYLEPHRINE 40 MCG/ML (10ML) SYRINGE FOR IV PUSH (FOR BLOOD PRESSURE SUPPORT)
PREFILLED_SYRINGE | INTRAVENOUS | Status: AC
  Filled 2013-06-21: qty 10

## 2013-06-21 MED ORDER — PRENATAL MULTIVITAMIN CH
1.0000 | ORAL_TABLET | Freq: Every day | ORAL | Status: DC
  Administered 2013-06-22: 1 via ORAL
  Filled 2013-06-21: qty 1

## 2013-06-21 MED ORDER — IBUPROFEN 600 MG PO TABS
600.0000 mg | ORAL_TABLET | Freq: Four times a day (QID) | ORAL | Status: DC
  Administered 2013-06-21 – 2013-06-22 (×4): 600 mg via ORAL
  Filled 2013-06-21 (×4): qty 1

## 2013-06-21 MED ORDER — LACTATED RINGERS IV SOLN
INTRAVENOUS | Status: DC
  Administered 2013-06-21 (×2): via INTRAVENOUS

## 2013-06-21 MED ORDER — ZOLPIDEM TARTRATE 5 MG PO TABS: 5.0000 mg | ORAL_TABLET | Freq: Every evening | ORAL | Status: DC | PRN

## 2013-06-21 MED ORDER — LACTATED RINGERS IV SOLN: 500.0000 mL | Freq: Once | INTRAVENOUS | Status: DC

## 2013-06-21 MED ORDER — IBUPROFEN 600 MG PO TABS: 600.0000 mg | ORAL_TABLET | Freq: Four times a day (QID) | ORAL | Status: DC | PRN

## 2013-06-21 MED ORDER — NALBUPHINE HCL 10 MG/ML IJ SOLN
10.0000 mg | Freq: Once | INTRAMUSCULAR | Status: AC
  Administered 2013-06-21: 10 mg via INTRAMUSCULAR
  Filled 2013-06-21: qty 1

## 2013-06-21 MED ORDER — LIDOCAINE HCL (PF) 1 % IJ SOLN
INTRAMUSCULAR | Status: AC
  Filled 2013-06-21: qty 30

## 2013-06-21 MED ORDER — DIPHENHYDRAMINE HCL 50 MG/ML IJ SOLN: 12.5000 mg | INTRAMUSCULAR | Status: DC | PRN

## 2013-06-21 MED ORDER — FENTANYL 2.5 MCG/ML BUPIVACAINE 1/10 % EPIDURAL INFUSION (WH - ANES): 14.0000 mL/h | INTRAMUSCULAR | Status: DC | PRN

## 2013-06-21 MED ORDER — FLEET ENEMA 7-19 GM/118ML RE ENEM: 1.0000 | ENEMA | RECTAL | Status: DC | PRN

## 2013-06-21 MED ORDER — OXYCODONE-ACETAMINOPHEN 5-325 MG PO TABS: 1.0000 | ORAL_TABLET | ORAL | Status: DC | PRN

## 2013-06-21 MED ORDER — FENTANYL 2.5 MCG/ML BUPIVACAINE 1/10 % EPIDURAL INFUSION (WH - ANES)
INTRAMUSCULAR | Status: AC
  Filled 2013-06-21: qty 125

## 2013-06-21 MED ORDER — LACTATED RINGERS IV SOLN: 500.0000 mL | INTRAVENOUS | Status: DC | PRN

## 2013-06-21 MED ORDER — TETANUS-DIPHTH-ACELL PERTUSSIS 5-2.5-18.5 LF-MCG/0.5 IM SUSP: 0.5000 mL | Freq: Once | INTRAMUSCULAR | Status: DC

## 2013-06-21 MED ORDER — WITCH HAZEL-GLYCERIN EX PADS: 1.0000 "application " | MEDICATED_PAD | CUTANEOUS | Status: DC | PRN

## 2013-06-21 MED ORDER — ACETAMINOPHEN 325 MG PO TABS
650.0000 mg | ORAL_TABLET | ORAL | Status: DC | PRN
Start: 2013-06-21 — End: 2013-06-21

## 2013-06-21 MED ORDER — SIMETHICONE 80 MG PO CHEW: 80.0000 mg | CHEWABLE_TABLET | ORAL | Status: DC | PRN

## 2013-06-21 MED ORDER — OXYTOCIN BOLUS FROM INFUSION: 500.0000 mL | INTRAVENOUS | Status: DC

## 2013-06-21 MED ORDER — ONDANSETRON HCL 4 MG/2ML IJ SOLN: 4.0000 mg | INTRAMUSCULAR | Status: DC | PRN

## 2013-06-21 MED ORDER — BENZOCAINE-MENTHOL 20-0.5 % EX AERO
1.0000 | INHALATION_SPRAY | CUTANEOUS | Status: DC | PRN
Start: 2013-06-21 — End: 2013-06-22
  Administered 2013-06-21: 1 via TOPICAL
  Filled 2013-06-21: qty 56

## 2013-06-21 MED ORDER — EPHEDRINE 5 MG/ML INJ
INTRAVENOUS | Status: AC
  Filled 2013-06-21: qty 4

## 2013-06-21 MED ORDER — MISOPROSTOL 200 MCG PO TABS
800.0000 ug | ORAL_TABLET | Freq: Once | ORAL | Status: AC
  Administered 2013-06-21: 800 ug via RECTAL
  Filled 2013-06-21: qty 4

## 2013-06-21 MED ORDER — FENTANYL 2.5 MCG/ML BUPIVACAINE 1/10 % EPIDURAL INFUSION (WH - ANES)
14.0000 mL/h | INTRAMUSCULAR | Status: DC | PRN
  Administered 2013-06-21: 14 mL/h via EPIDURAL

## 2013-06-21 MED ORDER — OXYTOCIN 40 UNITS IN LACTATED RINGERS INFUSION - SIMPLE MED
62.5000 mL/h | INTRAVENOUS | Status: DC
  Administered 2013-06-21: 62.5 mL/h via INTRAVENOUS
  Filled 2013-06-21: qty 1000

## 2013-06-21 MED ORDER — LIDOCAINE HCL (PF) 1 % IJ SOLN
30.0000 mL | INTRAMUSCULAR | Status: AC | PRN
  Administered 2013-06-21 (×2): 5 mL via SUBCUTANEOUS

## 2013-06-21 MED ORDER — SENNOSIDES-DOCUSATE SODIUM 8.6-50 MG PO TABS
2.0000 | ORAL_TABLET | ORAL | Status: DC
  Administered 2013-06-22: 2 via ORAL
  Filled 2013-06-21: qty 2

## 2013-06-21 MED ORDER — ONDANSETRON HCL 4 MG PO TABS: 4.0000 mg | ORAL_TABLET | ORAL | Status: DC | PRN

## 2013-06-21 MED ORDER — LANOLIN HYDROUS EX OINT: TOPICAL_OINTMENT | CUTANEOUS | Status: DC | PRN

## 2013-06-21 MED ORDER — OXYTOCIN 40 UNITS IN LACTATED RINGERS INFUSION - SIMPLE MED: 62.5000 mL/h | INTRAVENOUS | Status: DC | PRN

## 2013-06-21 MED ORDER — CITRIC ACID-SODIUM CITRATE 334-500 MG/5ML PO SOLN: 30.0000 mL | ORAL | Status: DC | PRN

## 2013-06-21 MED ORDER — ONDANSETRON HCL 4 MG/2ML IJ SOLN: 4.0000 mg | Freq: Four times a day (QID) | INTRAMUSCULAR | Status: DC | PRN

## 2013-06-21 NOTE — Progress Notes (Signed)
efm removed per anesthesia

## 2013-06-21 NOTE — H&P (Signed)
Attestation of Attending Supervision of Advanced Practitioner (PA/CNM/NP): Evaluation and management procedures were performed by the Advanced Practitioner under my supervision and collaboration.  I have reviewed the Advanced Practitioner's note and chart, and I agree with the management and plan.  Donnamae Jude, MD [facility] Attending 06/21/2013 9:22 AM

## 2013-06-21 NOTE — H&P (Signed)
Victoria Trujillo is a 28 y.o. female at 11.4 for labor evaluation. She states that her contractions started around 2 am this morning and have become more intense since then. She denies decreased fetal movement and vaginal discharge. She does endorse some clear vaginal discharge after she got here (after first cervical check). She denies headache, scotoma, and RUQ pain. She states she had GHTN with her last pregnancy.   History OB History   Grav Para Term Preterm Abortions TAB SAB Ect Mult Living   3 1 1  0 1 1 0 0 0 1     Past Medical History  Diagnosis Date  . Infection     UTI  . Ovarian cyst   . Headache(784.0)   . Pregnancy induced hypertension    Past Surgical History  Procedure Laterality Date  . Therapeutic abortion    . Wisdom tooth extraction     Family History: family history includes Cancer in her maternal grandmother; Diabetes in her father; Heart disease in her father; Hypertension in her father, maternal grandmother, and mother; Kidney disease in her maternal grandfather; Stroke in her father. Social History:  reports that she has quit smoking. She has never used smokeless tobacco. She reports that she does not drink alcohol or use illicit drugs.   Prenatal Transfer Tool  Maternal Diabetes: No Genetic Screening: Normal Maternal Ultrasounds/Referrals: Normal Fetal Ultrasounds or other Referrals:  None Maternal Substance Abuse:  No Significant Maternal Medications:  None Significant Maternal Lab Results:  Lab values include: Group B Strep negative Other Comments:  None  ROS Per HPI  Dilation: 5 Effacement (%): 90 Station: -3 Exam by:: Jorje Guild RNC Blood pressure 118/62, pulse 106, resp. rate 18, height 5\' 5"  (1.651 m), weight 111.948 kg (246 lb 12.8 oz), last menstrual period 09/17/2012. Exam Physical Exam   Gen: NAD, alert, cooperative with exam  HEENT: NCAT, EOMI, PERRL  CV: RRR, good S1/S2, no murmur  Resp: CTABL, no wheezes, non-labored  Abd: Soft  gravid abdomen, non tender to palp  Ext: [puffy non pitting edema BL LE,  Neuro: Alert and oriented, No gross deficits, 2-3+ DTRs on BL brachioradialis and 3 Beats of clonus BL LE.   Dilation: 5 Effacement (%): 90 Station: -3 Presentation: Vertex Exam by:: Jorje Guild RNC\  FHT: Baseline 140, moderate variability, positive accels, No decels Toco: regular q 2-3 min  Prenatal labs: ABO, Rh: A/POS/-- (07/09 1141) Antibody: NEG (07/09 1141) Rubella: 1.37 (07/09 1141) RPR: NON REAC (12/12 1138)  HBsAg: NEGATIVE (07/09 1141)  HIV: NON REACTIVE (12/12 1138)  GBS: Negative (02/25 0000)   Assessment/Plan: BRAZIL VOYTKO is a 28 y.o. G3P1011 at [redacted]w[redacted]d by 12 week [place] here for active labor.   #Labor: Active labor, manage expectantly #Pain: Epidural #FWB: Category 1 tracing #ID:  GBS negative #MOF: Breast #MOC: Pearson Forster 06/21/2013, 6:34 AM  I was present for the exam and agree with above. Transient HTN. No evidence of pre-eclampsia.  [place], CNM 06/21/2013 6:50 AM

## 2013-06-21 NOTE — MAU Note (Signed)
Contractions every 3 minutes x 1 hour. Denies LOF or VB. Positive fetal movement. Denies complications with this pregnancy. States was dilated 2 cm last week.

## 2013-06-21 NOTE — Progress Notes (Signed)
LESTER PLATAS is a 28 y.o. G3P1011 at [redacted]w[redacted]d by ultrasound admitted for active labor  Subjective: Pt with no complaints  Objective: BP 111/67  Pulse 108  Temp(Src) 99.1 F (37.3 C) (Oral)  Resp 18  Ht 5\' 5"  (1.651 m)  Wt 246 lb 12.8 oz (111.948 kg)  BMI 41.07 kg/m2  LMP 09/17/2012   Total I/O In: -  Out: 100 [Urine:100]  FHT:  FHR: 130's bpm, variability: moderate,  accelerations:  Present,  decelerations:  Present cat I UC:   regular, every 3-4 minutes SVE:   Dilation: 8 Effacement (%): 90 Station: -3 Exam by:: Orlinda Blalock RN 9/100/-1/ vertex Labs: Lab Results  Component Value Date   WBC 9.8 06/21/2013   HGB 14.1 06/21/2013   HCT 41.5 06/21/2013   MCV 87.2 06/21/2013   PLT 255 06/21/2013    Assessment / Plan: Spontaneous labor, progressing normally  Labor: Progressing normally Fetal Wellbeing:  Category I Pain Control:  Epidural I/D:  n/a Anticipated MOD:  NSVD  HARRAWAY-SMITH, Akila Batta 06/21/2013, 12:41 PM

## 2013-06-21 NOTE — Anesthesia Preprocedure Evaluation (Signed)
Anesthesia Evaluation  Patient identified by MRN, date of birth, ID band Patient awake    Reviewed: Allergy & Precautions, H&P , NPO status , Patient's Chart, lab work & pertinent test results  Airway Mallampati: III TM Distance: >3 FB Neck ROM: Full    Dental  (+) Teeth Intact   Pulmonary neg shortness of breath, neg sleep apnea, neg COPDneg recent URI, former smoker,    Pulmonary exam normal       Cardiovascular negative cardio ROS  Rhythm:Regular Rate:Normal     Neuro/Psych  Headaches, negative psych ROS   GI/Hepatic negative GI ROS, Neg liver ROS,   Endo/Other  negative endocrine ROS  Renal/GU negative Renal ROS  negative genitourinary   Musculoskeletal negative musculoskeletal ROS (+)   Abdominal   Peds  Hematology negative hematology ROS (+)   Anesthesia Other Findings   Reproductive/Obstetrics (+) Pregnancy                           Anesthesia Physical Anesthesia Plan  ASA: II  Anesthesia Plan: Epidural   Post-op Pain Management:    Induction:   Airway Management Planned: Natural Airway  Additional Equipment: None  Intra-op Plan:   Post-operative Plan:   Informed Consent: I have reviewed the patients History and Physical, chart, labs and discussed the procedure including the risks, benefits and alternatives for the proposed anesthesia with the patient or authorized representative who has indicated his/her understanding and acceptance.   Dental advisory given  Plan Discussed with: Anesthesiologist  Anesthesia Plan Comments:         Anesthesia Quick Evaluation

## 2013-06-21 NOTE — Anesthesia Procedure Notes (Signed)
Epidural Patient location during procedure: OB Start time: 06/21/2013 7:47 AM End time: 06/21/2013 7:56 AM  Staffing Anesthesiologist: Anaijah Augsburger, CHRIS Performed by: anesthesiologist   Preanesthetic Checklist Completed: patient identified, surgical consent, pre-op evaluation, timeout performed, IV checked, risks and benefits discussed and monitors and equipment checked  Epidural Patient position: sitting Prep: site prepped and draped and DuraPrep Patient monitoring: heart rate, cardiac monitor, continuous pulse ox and blood pressure Approach: midline Location: L3-L4 Injection technique: LOR saline  Needle:  Needle type: Tuohy  Needle gauge: 17 G Needle length: 9 cm Catheter type: closed end flexible Catheter size: 19 Gauge Catheter at skin depth: 14 cm Test dose: Other  Assessment Sensory level: T8 Events: blood not aspirated, injection not painful, no injection resistance, negative IV test and no paresthesia  Additional Notes H+P and labs checked, risks and benefits discussed with the patient, consent obtained, procedure tolerated well and without complications.  Reason for block:procedure for pain

## 2013-06-22 MED ORDER — ONDANSETRON HCL 4 MG/2ML IJ SOLN
INTRAMUSCULAR | Status: AC
  Filled 2013-06-22: qty 2

## 2013-06-22 MED ORDER — OXYCODONE-ACETAMINOPHEN 5-325 MG PO TABS
1.0000 | ORAL_TABLET | Freq: Four times a day (QID) | ORAL | Status: DC | PRN
  Administered 2013-06-22: 1 via ORAL
  Filled 2013-06-22: qty 1

## 2013-06-22 MED ORDER — KETOROLAC TROMETHAMINE 30 MG/ML IJ SOLN
INTRAMUSCULAR | Status: AC
  Filled 2013-06-22: qty 1

## 2013-06-22 MED ORDER — METHYLENE BLUE 1 % INJ SOLN
INTRAMUSCULAR | Status: AC
  Filled 2013-06-22: qty 10

## 2013-06-22 MED ORDER — MIDAZOLAM HCL 2 MG/2ML IJ SOLN
INTRAMUSCULAR | Status: AC
  Filled 2013-06-22: qty 2

## 2013-06-22 MED ORDER — PROPOFOL 10 MG/ML IV EMUL
INTRAVENOUS | Status: AC
  Filled 2013-06-22: qty 20

## 2013-06-22 MED ORDER — GLYCOPYRROLATE 0.2 MG/ML IJ SOLN
INTRAMUSCULAR | Status: AC
  Filled 2013-06-22: qty 4

## 2013-06-22 MED ORDER — NEOSTIGMINE METHYLSULFATE 1 MG/ML IJ SOLN
INTRAMUSCULAR | Status: AC
  Filled 2013-06-22: qty 1

## 2013-06-22 MED ORDER — HYDROMORPHONE HCL PF 1 MG/ML IJ SOLN
INTRAMUSCULAR | Status: AC
  Filled 2013-06-22: qty 1

## 2013-06-22 MED ORDER — DEXAMETHASONE SODIUM PHOSPHATE 10 MG/ML IJ SOLN
INTRAMUSCULAR | Status: AC
  Filled 2013-06-22: qty 1

## 2013-06-22 MED ORDER — FENTANYL CITRATE 0.05 MG/ML IJ SOLN
INTRAMUSCULAR | Status: AC
  Filled 2013-06-22: qty 5

## 2013-06-22 MED ORDER — IBUPROFEN 600 MG PO TABS: 600.0000 mg | ORAL_TABLET | Freq: Four times a day (QID) | ORAL | Status: DC

## 2013-06-22 MED ORDER — ROCURONIUM BROMIDE 100 MG/10ML IV SOLN
INTRAVENOUS | Status: AC
  Filled 2013-06-22: qty 1

## 2013-06-22 MED ORDER — LIDOCAINE HCL (CARDIAC) 20 MG/ML IV SOLN
INTRAVENOUS | Status: AC
  Filled 2013-06-22: qty 5

## 2013-06-22 NOTE — Anesthesia Postprocedure Evaluation (Signed)
Anesthesia Post Note  Patient: Victoria Trujillo  Procedure(s) Performed: * No procedures listed *  Anesthesia type: Epidural  Patient location: Mother/Baby  Post pain: Pain level controlled  Post assessment: Post-op Vital signs reviewed  Last Vitals:  Filed Vitals:   06/22/13 0646  BP: 110/67  Pulse: 78  Temp: 36.6 C  Resp: 18    Post vital signs: Reviewed  Level of consciousness:alert  Complications: No apparent anesthesia complications

## 2013-06-22 NOTE — Discharge Summary (Signed)
Obstetric Discharge Summary Reason for Admission: onset of labor Prenatal Procedures: NST Intrapartum Procedures: spontaneous vaginal delivery Postpartum Procedures: none Complications-Operative and Postpartum: periurethral laceration Hemoglobin  Date Value Ref Range Status  06/21/2013 14.1  12.0 - 15.0 g/dL Final     HCT  Date Value Ref Range Status  06/21/2013 41.5  36.0 - 46.0 % Final   Hospital Course:  Victoria Trujillo is a 28 yo G3P2012 at [redacted]w[redacted]d who came in for active labor. She delivered a viable baby girl via NSVD with Apgars of 8 and 9.  The labor was complicated by meconium stained fluid, but postpartum course was uncomplicated.  EBL was 300 mL and mom had unrepaired periurethral lacerations. Mother and baby are doing well on postpartum unit.   Physical Exam:  General: alert, cooperative and no distress Lochia: appropriate Uterine Fundus: firm Incision: N/A DVT Evaluation: No evidence of DVT seen on physical exam.  Discharge Diagnoses: Term Pregnancy-delivered  Discharge Information: Date: 06/22/2013 Activity: unrestricted Diet: routine Medications: PNV and Ibuprofen Condition: stable Instructions: refer to practice specific booklet Discharge to: home Follow-up Information   Call [facility] (To schedule 6 week postpartum appt)    Specialty:  Obstetrics and Gynecology   Contact information:   [place] [address] [phone]      Newborn Data: Live born female  Birth Weight: 7 lb 7.2 oz (3380 g) APGAR: 8, 9  Home with mother.  Victoria Trujillo 06/22/2013, 8:53 AM  I have seen and examined this patient and I agree with the above. Victoria Trujillo 8:50 AM 06/24/2013

## 2013-06-22 NOTE — Discharge Instructions (Signed)

## 2013-06-22 NOTE — Lactation Note (Signed)
This note was copied from the chart of Victoria Trujillo. Lactation Consultation Note Initial consult:  Mother has been formula feeding and requested assistance with breastfeeding.  Taught breast massage and hand expression to mother and scant amount of colostrum expressed.  Baby sleeping, undressed baby to wake to feed.  Assisted mother in placing baby in football hold.  Baby sleepy and uninterested in latching at this time, tongue sucking.  Reviewed basics, STS, feeding cues, lactation support services and brochure.  Baby STS on mother's chest.  Encouraged mother to call for further assistance.   Patient Name: Victoria Nayeliz Hipp [id number] Date: 06/22/2013 Reason for consult: Initial assessment   Maternal Data Has patient been taught Hand Expression?: Yes Does the patient have breastfeeding experience prior to this delivery?: No  Feeding Feeding Type: Breast Fed  LATCH Score/Interventions                      Lactation Tools Discussed/Used     Consult Status Consult Status: Follow-up Date: 06/23/13 Follow-up type: In-patient    Vivianne Master [facility] 06/22/2013, 12:12 PM

## 2013-06-24 NOTE — Discharge Summary (Signed)
`````  Attestation of Attending Supervision of Advanced Practitioner: Evaluation and management procedures were performed by the PA/NP/CNM/OB Fellow under my supervision/collaboration. Chart reviewed and agree with management and plan.  Jonnie Kind 06/24/2013 7:51 PM

## 2013-06-26 ENCOUNTER — Encounter: Payer: Medicaid Other | Admitting: Obstetrics & Gynecology

## 2013-07-27 ENCOUNTER — Ambulatory Visit (INDEPENDENT_AMBULATORY_CARE_PROVIDER_SITE_OTHER): Payer: Medicaid Other | Admitting: Obstetrics and Gynecology

## 2013-07-27 ENCOUNTER — Encounter: Payer: Self-pay | Admitting: Obstetrics and Gynecology

## 2013-07-27 VITALS — BP 139/91 | HR 70 | Temp 99.4°F | Ht 65.0 in | Wt 212.7 lb

## 2013-07-27 DIAGNOSIS — Z30430 Encounter for insertion of intrauterine contraceptive device: Secondary | ICD-10-CM

## 2013-07-27 DIAGNOSIS — Z01812 Encounter for preprocedural laboratory examination: Secondary | ICD-10-CM

## 2013-07-27 LAB — POCT PREGNANCY, URINE: Preg Test, Ur: NEGATIVE

## 2013-07-27 MED ORDER — LEVONORGESTREL 20 MCG/24HR IU IUD
INTRAUTERINE_SYSTEM | Freq: Once | INTRAUTERINE | Status: AC
  Administered 2013-07-27: 16:00:00 via INTRAUTERINE

## 2013-07-27 NOTE — Patient Instructions (Signed)
Intrauterine Device Insertion   Most often, an intrauterine device (IUD) is inserted into the uterus to prevent pregnancy. There are 2 types of IUDs available:  · Copper IUD This type of IUD creates an environment that is not favorable to sperm survival. The mechanism of action of the copper IUD is not known for certain. It can stay in place for 10 years.  · Hormone IUD This type of IUD contains the hormone progestin (synthetic progesterone). The progestin thickens the cervical mucus and prevents sperm from entering the uterus, and it also thins the uterine lining. There is no evidence that the hormone IUD prevents implantation. One hormone IUD can stay in place for up to 5 years, and a different hormone IUD can stay in place for up to 3 years.  An IUD is the most cost-effective birth control if left in place for the full duration. It may be removed at any time.  LET YOUR HEALTH CARE PROVIDER KNOW ABOUT:  · Any allergies you have.  · All medicines you are taking, including vitamins, herbs, eye drops, creams, and over-the-counter medicines.  · Previous problems you or members of your family have had with the use of anesthetics.  · Any blood disorders you have.  · Previous surgeries you have had.  · Possibility of pregnancy.  · Medical conditions you have.  RISKS AND COMPLICATIONS   Generally, intrauterine device insertion is a safe procedure. However, as with any procedure, complications can occur. Possible complications include:  · Accidental puncture (perforation) of the uterus.  · Accidental placement of the IUD either in the muscle layer of the uterus (myometrium) or outside the uterus. If this happens, the IUD can be found essentially floating around the bowels and must be taken out surgically.  · The IUD may fall out of the uterus (expulsion). This is more common in women who have recently had a child.    · Pregnancy in the fallopian tube (ectopic).  · Pelvic inflammatory disease (PID), which is infection of  the uterus and fallopian tubes. The risk of PID is slightly increased in the first 20 days after the IUD is placed, but the overall risk is still very low.  BEFORE THE PROCEDURE  · Schedule the IUD insertion for when you will have your menstrual period or right after, to make sure you are not pregnant. Placement of the IUD is better tolerated shortly after a menstrual cycle.  · You may need to take tests or be examined to make sure you are not pregnant.  · You may be required to take a pregnancy test.  · You may be required to get checked for sexually transmitted infections (STIs) prior to placement. Placing an IUD in someone who has an infection can make the infection worse.  · You may be given a pain reliever to take 1 or 2 hours before the procedure.  · An exam will be performed to determine the size and position of your uterus.  · Ask your health care provider about changing or stopping your regular medicines.  PROCEDURE   · A tool (speculum) is placed in the vagina. This allows your health care provider to see the lower part of the uterus (cervix).  · The cervix is prepped with a medicine that lowers the risk of infection.  · You may be given a medicine to numb each side of the cervix (intracervical or paracervical block). This is used to block and control any discomfort with insertion.  · A tool (  uterine sound) is inserted into the uterus to determine the length of the uterine cavity and the direction the uterus may be tilted.  · A slim instrument (IUD inserter) is inserted through the cervical canal and into your uterus.  · The IUD is placed in the uterine cavity and the insertion device is removed.  · The nylon string that is attached to the IUD and used for eventual IUD removal is trimmed. It is trimmed so that it lays high in the vagina, just outside the cervix.  AFTER THE PROCEDURE  · You may have bleeding after the procedure. This is normal. It varies from light spotting for a few days to menstrual-like  bleeding.   · You may have mild cramping.  Document Released: 12/10/2010 Document Revised: 02/01/2013 Document Reviewed: 10/02/2012  [employer]® Patient Information ©2014 [employer].

## 2013-07-27 NOTE — Progress Notes (Signed)
  Subjective:     Victoria Trujillo is a 28 y.o. female [phone] who presents for a postpartum visit. She is 4 weeks postpartum following a spontaneous vaginal delivery. I have fully reviewed the prenatal and intrapartum course. The delivery was at 39.4 gestational weeks. Outcome: spontaneous vaginal delivery. Anesthesia: epidural. Postpartum course has been uncomplicated. Baby's course has been uncomplicated. Baby is feeding by breast and bottle. Bleeding no bleeding. Bowel function is normal. Bladder function is normal. Patient is sexually active. Contraception method is condoms. Postpartum depression screening: negative. Had protected intercourse x1 with condom. Wants to proceed with IUD.  The following portions of the patient's history were reviewed and updated as appropriate: allergies, current medications, past family history, past medical history, past social history, past surgical history and problem list.  Review of Systems Pertinent items are noted in HPI.   Objective:    BP 139/91  Pulse 70  Temp(Src) 99.4 F (37.4 C) (Oral)  Ht 5\' 5"  (1.651 m)  Wt 212 lb 11.2 oz (96.48 kg)  BMI 35.40 kg/m2  Breastfeeding? Yes  General:  alert, cooperative and no distress   Breasts:  inspection negative, no nipple discharge or bleeding, no masses or nodularity palpable  Lungs: clear to auscultation bilaterally  Heart:  regular rate and rhythm, S1, S2 normal, no murmur, click, rub or gallop  Abdomen: soft, non-tender; bowel sounds normal; no masses,  no organomegaly   Vulva:  normal  Vagina: normal vagina  Cervix:  multiparous appearance  Corpus: normal size, contour, position, consistency, mobility, non-tender  Adnexa:  no mass, fullness, tenderness  Rectal Exam: Not performed.        Assessment:     4 wks postpartum exam. Pap smear not done at today's visit.   Plan:    1. Contraception: IUD 2. Continue PNVs.  3. Follow up in: 6 weeks for string check  or as needed.   Procedure  Note Explained risks of Mirena  Including bleeding, infection, perforation, expulsion and pt understands and accepts. UPT negative. Pt in lithotomy, cs brought into view with speculum. Cx cleanssed with Beatadind swab x2. Single-tooth tenaculum placed ant lip. IUD inserted in usual fashion. Strings cut to 3 cm. Tenaculum removed with no active bleeding. Pt. tolerated procedure well

## 2013-07-27 NOTE — Addendum Note (Signed)
Addended by: Samuel Germany on: 07/27/2013 03:55 PM   Modules accepted: Orders

## 2013-08-23 ENCOUNTER — Encounter: Payer: Self-pay | Admitting: *Deleted

## 2013-08-30 ENCOUNTER — Encounter (HOSPITAL_COMMUNITY): Payer: Self-pay | Admitting: Emergency Medicine

## 2013-08-30 ENCOUNTER — Emergency Department (INDEPENDENT_AMBULATORY_CARE_PROVIDER_SITE_OTHER)
Admission: EM | Admit: 2013-08-30 | Discharge: 2013-08-30 | Disposition: A | Discharge status: Home / Self Care | Payer: Medicaid Other | Source: Home / Self Care

## 2013-08-30 DIAGNOSIS — H6120 Impacted cerumen, unspecified ear: Secondary | ICD-10-CM

## 2013-08-30 DIAGNOSIS — H6690 Otitis media, unspecified, unspecified ear: Secondary | ICD-10-CM

## 2013-08-30 DIAGNOSIS — H6692 Otitis media, unspecified, left ear: Secondary | ICD-10-CM

## 2013-08-30 DIAGNOSIS — H918X9 Other specified hearing loss, unspecified ear: Secondary | ICD-10-CM

## 2013-08-30 MED ORDER — AMOXICILLIN 500 MG PO CAPS: 1000.0000 mg | ORAL_CAPSULE | Freq: Two times a day (BID) | ORAL | Status: DC

## 2013-08-30 MED ORDER — ANTIPYRINE-BENZOCAINE 5.4-1.4 % OT SOLN: 3.0000 [drp] | OTIC | Status: DC | PRN

## 2013-08-30 NOTE — ED Provider Notes (Signed)
CSN: [id number]     Arrival date & time 08/30/13  1541 History   First MD Initiated Contact with Patient 08/30/13 1559     Chief Complaint  Patient presents with  . Otalgia   (Consider location/radiation/quality/duration/timing/severity/associated sxs/prior Treatment) HPI Comments: C/O left ear pressure, discomfort and decrease in hearing for over a week.   Past Medical History  Diagnosis Date  . Infection     UTI  . Ovarian cyst   . Headache(784.0)   . Pregnancy induced hypertension    Past Surgical History  Procedure Laterality Date  . Therapeutic abortion    . Wisdom tooth extraction     Family History  Problem Relation Age of Onset  . Hypertension Mother   . Diabetes Father   . Hypertension Father   . Heart disease Father   . Stroke Father   . Hypertension Maternal Grandmother   . Cancer Maternal Grandmother   . Kidney disease Maternal Grandfather    History  Substance Use Topics  . Smoking status: Former Research scientist (life sciences)  . Smokeless tobacco: Never Used     Comment: not daily  . Alcohol Use: No   OB History   Grav Para Term Preterm Abortions TAB SAB Ect Mult Living   3 2 2  0 1 1 0 0 0 2     Review of Systems  HENT:       As per HPI  All other systems reviewed and are negative.   Allergies  Review of patient's allergies indicates no known allergies.  Home Medications   Prior to Admission medications   Medication Sig Start Date End Date Taking? Authorizing Provider  Prenatal Vit-Fe Fumarate-FA (MULTIVITAMIN-PRENATAL) 27-0.8 MG TABS tablet Take 1 tablet by mouth daily at 12 noon.    Historical Provider, MD   BP 120/79  Pulse 76  Temp(Src) 98.3 F (36.8 C) (Oral)  Resp 18  SpO2 97%  LMP 08/30/2013  Breastfeeding? No Physical Exam  Nursing note and vitals reviewed. Constitutional: She is oriented to person, place, and time. She appears well-developed and well-nourished. She appears distressed.  HENT:  L TM obstructed by cerumen in EAC R TM partially  obstructed wit cerumen  Post irrigation TM is retracted and deeply red  Eyes: Conjunctivae and EOM are normal.  Neck: Normal range of motion. Neck supple.  Neurological: She is alert and oriented to person, place, and time.  Skin: Skin is warm and dry.  Psychiatric: She has a normal mood and affect.    ED Course  Procedures (including critical care time) Labs Review Labs Reviewed - No data to display  Imaging Review No results found.   MDM   1. Hearing loss secondary to cerumen impaction     Irrigate L EAC auralan for pain Amoxil for OM Sudafed to help with congestion   Janne Napoleon, NP 08/30/13 1713

## 2013-08-30 NOTE — ED Notes (Signed)
C/o left ear pain and pressure x 1 wk.  Mild drainage.  Mild hearing loss.  Denies fever.  No relief with otc ear drops.

## 2013-08-30 NOTE — Discharge Instructions (Signed)
Cerumen Impaction A cerumen impaction is when the wax in your ear forms a plug. This plug usually causes reduced hearing. Sometimes it also causes an earache or dizziness. Removing a cerumen impaction can be difficult and painful. The wax sticks to the ear canal. The canal is sensitive and bleeds easily. If you try to remove a heavy wax buildup with a cotton tipped swab, you may push it in further. Irrigation with water, suction, and small ear curettes may be used to clear out the wax. If the impaction is fixed to the skin in the ear canal, ear drops may be needed for a few days to loosen the wax. People who build up a lot of wax frequently can use ear wax removal products available in your local drugstore. SEEK MEDICAL CARE IF:  You develop an earache, increased hearing loss, or marked dizziness. Document Released: 05/21/2004 Document Revised: 07/06/2011 Document Reviewed: 07/11/2009 [facility] Patient Information 2014 [place], [place].  Hearing Loss A hearing loss is sometimes called deafness. Hearing loss may be partial or total. CAUSES Hearing loss may be caused by:  Wax in the ear canal.  Infection of the ear canal.  Infection of the middle ear.  Trauma to the ear or surrounding area.  Fluid in the middle ear.  A hole in the eardrum (perforated eardrum).  Exposure to loud sounds or music.  Problems with the hearing nerve.  Certain medications. Hearing loss without wax, infection, or a history of injury may mean that the nerve is involved. Hearing loss with severe dizziness, nausea and vomiting or ringing in the ear may suggest a hearing nerve irritation or problems in the middle or inner ear. If hearing loss is untreated, there is a greater likelihood for residual or permanent hearing loss. DIAGNOSIS A hearing test (audiometry) assesses hearing loss. The audiometry test needs to be performed by a hearing specialist (audiologist). TREATMENT Treatment for recent onset of hearing  loss may include:  Ear wax removal.  Medications that kill germs (antibiotics).  Cortisone medications.  Prompt follow up with the appropriate specialist. Return of hearing depends on the cause of your hearing loss, so proper medical follow-up is important. Some hearing loss may not be reversible, and a caregiver should discuss care and treatment options with you. SEEK MEDICAL CARE IF:   You have a severe headache, dizziness, or changes in vision.  You have new or increased weakness.  You develop repeated vomiting or other serious medical problems.  You have a fever. Document Released: 04/13/2005 Document Revised: 07/06/2011 Document Reviewed: 08/08/2009 [facility] Patient Information 2014 [place], [place].

## 2013-08-30 NOTE — ED Provider Notes (Signed)
Medical screening examination/treatment/procedure(s) were performed by resident physician or non-physician practitioner and as supervising physician I was immediately available for consultation/collaboration.   KINDL,JAMES DOUGLAS MD.   James D Kindl, MD 08/30/13 2142 

## 2014-02-13 ENCOUNTER — Encounter (HOSPITAL_COMMUNITY): Payer: Self-pay | Admitting: Emergency Medicine

## 2014-02-13 ENCOUNTER — Emergency Department (INDEPENDENT_AMBULATORY_CARE_PROVIDER_SITE_OTHER)
Admission: EM | Admit: 2014-02-13 | Discharge: 2014-02-13 | Disposition: A | Discharge status: Home / Self Care | Payer: Self-pay | Source: Home / Self Care | Attending: Family Medicine | Admitting: Family Medicine

## 2014-02-13 ENCOUNTER — Telehealth (HOSPITAL_COMMUNITY): Payer: Self-pay | Admitting: *Deleted

## 2014-02-13 DIAGNOSIS — K047 Periapical abscess without sinus: Secondary | ICD-10-CM

## 2014-02-13 MED ORDER — CLINDAMYCIN HCL 300 MG PO CAPS: 300.0000 mg | ORAL_CAPSULE | Freq: Three times a day (TID) | ORAL | Status: DC

## 2014-02-13 MED ORDER — DICLOFENAC POTASSIUM 50 MG PO TABS: 50.0000 mg | ORAL_TABLET | Freq: Three times a day (TID) | ORAL | Status: DC

## 2014-02-13 NOTE — ED Notes (Signed)
Pt.called and said her Rx.'s were $53.00 and $21.00 and she can't afford them-needs something on the $ 4.00 list.  Discussed with Dr. Bridgett Larsson.  She changed the Rx.'s to Amoxicillin 500 mg. BID x 10 days # 20 and Meloxicam 15 mg. 1 QD for 1 week then as needed # 30. I called pt. Back and told her.  She wants them called to [place] at [facility]. Rx.'s called to pharmacist @ [phone]. Roselyn Meier 02/13/2014

## 2014-02-13 NOTE — Discharge Instructions (Signed)
Take medicine as prescribed, see your dentist as soon as possible °

## 2014-02-13 NOTE — ED Notes (Signed)
Pt  Reports  Toothache    And    Swelling     To  r side of   Face          With  Symptoms   Since  Last   Thursday       Symptoms  Not  releived  By  Otc  meds

## 2014-02-13 NOTE — ED Provider Notes (Signed)
CSN: [id number]     Arrival date & time 02/13/14  1439 History   First MD Initiated Contact with Patient 02/13/14 1453     Chief Complaint  Patient presents with  . Dental Pain   (Consider location/radiation/quality/duration/timing/severity/associated sxs/prior Treatment) Patient is a 28 y.o. female presenting with tooth pain. The history is provided by the patient.  Dental Pain Location:  Lower Lower teeth location:  31/RL 2nd molar Quality:  Throbbing Severity:  Mild Onset quality:  Sudden Duration:  1 week Progression:  Worsening Chronicity:  New Context: abscess and dental caries   Associated symptoms: facial swelling and gum swelling   Associated symptoms: no facial pain and no fever     Past Medical History  Diagnosis Date  . Infection     UTI  . Ovarian cyst   . Headache(784.0)   . Pregnancy induced hypertension    Past Surgical History  Procedure Laterality Date  . Therapeutic abortion    . Wisdom tooth extraction     Family History  Problem Relation Age of Onset  . Hypertension Mother   . Diabetes Father   . Hypertension Father   . Heart disease Father   . Stroke Father   . Hypertension Maternal Grandmother   . Cancer Maternal Grandmother   . Kidney disease Maternal Grandfather    History  Substance Use Topics  . Smoking status: Former Research scientist (life sciences)  . Smokeless tobacco: Never Used     Comment: not daily  . Alcohol Use: No   OB History   Grav Para Term Preterm Abortions TAB SAB Ect Mult Living   3 2 2  0 1 1 0 0 0 2     Review of Systems  Constitutional: Negative for fever and chills.  HENT: Positive for dental problem and facial swelling.     Allergies  Review of patient's allergies indicates no known allergies.  Home Medications   Prior to Admission medications   Medication Sig Start Date End Date Taking? Authorizing Provider  amoxicillin (AMOXIL) 500 MG capsule Take 2 capsules (1,000 mg total) by mouth 2 (two) times daily. 08/30/13   Janne Napoleon, NP  antipyrine-benzocaine Toniann Fail) otic solution Place 3-4 drops into the left ear every 2 (two) hours as needed for ear pain. 08/30/13   Janne Napoleon, NP  clindamycin (CLEOCIN) 300 MG capsule Take 1 capsule (300 mg total) by mouth 3 (three) times daily. 02/13/14   Billy Fischer, MD  diclofenac (CATAFLAM) 50 MG tablet Take 1 tablet (50 mg total) by mouth 3 (three) times daily. 02/13/14   Billy Fischer, MD  Prenatal Vit-Fe Fumarate-FA (MULTIVITAMIN-PRENATAL) 27-0.8 MG TABS tablet Take 1 tablet by mouth daily at 12 noon.    Historical Provider, MD   Pulse 72  Temp(Src) 98.6 F (37 C) (Oral)  Resp 18  LMP 02/13/2014 Physical Exam  Nursing note and vitals reviewed. Constitutional: She is oriented to person, place, and time. She appears well-developed and well-nourished. No distress.  HENT:  Right Ear: External ear normal.  Left Ear: External ear normal.  Mouth/Throat: Oropharynx is clear and moist and mucous membranes are normal.    Neck: Normal range of motion. Neck supple.  Lymphadenopathy:    She has cervical adenopathy.  Neurological: She is alert and oriented to person, place, and time.  Skin: Skin is warm.    ED Course  Procedures (including critical care time) Labs Review Labs Reviewed - No data to display  Imaging Review No results found.  MDM   1. Abscess, dental        Billy Fischer, MD 02/14/14 2028

## 2014-02-26 ENCOUNTER — Encounter (HOSPITAL_COMMUNITY): Payer: Self-pay | Admitting: Emergency Medicine

## 2014-08-23 ENCOUNTER — Emergency Department (INDEPENDENT_AMBULATORY_CARE_PROVIDER_SITE_OTHER)
Admission: EM | Admit: 2014-08-23 | Discharge: 2014-08-23 | Disposition: A | Discharge status: Home / Self Care | Payer: 59 | Source: Home / Self Care | Attending: Emergency Medicine | Admitting: Emergency Medicine

## 2014-08-23 ENCOUNTER — Encounter (HOSPITAL_COMMUNITY): Payer: Self-pay | Admitting: Emergency Medicine

## 2014-08-23 DIAGNOSIS — H109 Unspecified conjunctivitis: Secondary | ICD-10-CM | POA: Diagnosis not present

## 2014-08-23 MED ORDER — POLYMYXIN B-TRIMETHOPRIM 10000-0.1 UNIT/ML-% OP SOLN: 1.0000 [drp] | Freq: Four times a day (QID) | OPHTHALMIC | Status: DC

## 2014-08-23 NOTE — Discharge Instructions (Signed)

## 2014-08-23 NOTE — ED Provider Notes (Signed)
CSN: [id number]     Arrival date & time 08/23/14  1034 History   First MD Initiated Contact with Patient 08/23/14 1212     Chief Complaint  Patient presents with  . Conjunctivitis   (Consider location/radiation/quality/duration/timing/severity/associated sxs/prior Treatment) HPI Comments: Husband dx'ed with same 4 days ago.  No contact lens use Reports herself to be otherwise healthy PCP: none   Patient is a 29 y.o. female presenting with conjunctivitis. The history is provided by the patient.  Conjunctivitis This is a new (right eye) problem. The current episode started yesterday. The problem occurs constantly. The problem has not changed since onset.Associated symptoms comments: none.    Past Medical History  Diagnosis Date  . Infection     UTI  . Ovarian cyst   . Headache(784.0)   . Pregnancy induced hypertension    Past Surgical History  Procedure Laterality Date  . Therapeutic abortion    . Wisdom tooth extraction     Family History  Problem Relation Age of Onset  . Hypertension Mother   . Diabetes Father   . Hypertension Father   . Heart disease Father   . Stroke Father   . Hypertension Maternal Grandmother   . Cancer Maternal Grandmother   . Kidney disease Maternal Grandfather    History  Substance Use Topics  . Smoking status: Former Research scientist (life sciences)  . Smokeless tobacco: Never Used     Comment: not daily  . Alcohol Use: No   OB History    Gravida Para Term Preterm AB TAB SAB Ectopic Multiple Living   3 2 2  0 1 1 0 0 0 2     Review of Systems  Constitutional: Negative.   HENT: Negative.   Eyes: Positive for redness. Negative for photophobia, pain, discharge, itching and visual disturbance.  All other systems reviewed and are negative.   Allergies  Review of patient's allergies indicates no known allergies.  Home Medications   Prior to Admission medications   Medication Sig Start Date End Date Taking? Authorizing Provider  amoxicillin (AMOXIL) 500 MG  capsule Take 2 capsules (1,000 mg total) by mouth 2 (two) times daily. 08/30/13   Janne Napoleon, NP  antipyrine-benzocaine Toniann Fail) otic solution Place 3-4 drops into the left ear every 2 (two) hours as needed for ear pain. 08/30/13   Janne Napoleon, NP  clindamycin (CLEOCIN) 300 MG capsule Take 1 capsule (300 mg total) by mouth 3 (three) times daily. 02/13/14   Billy Fischer, MD  diclofenac (CATAFLAM) 50 MG tablet Take 1 tablet (50 mg total) by mouth 3 (three) times daily. 02/13/14   Billy Fischer, MD  Prenatal Vit-Fe Fumarate-FA (MULTIVITAMIN-PRENATAL) 27-0.8 MG TABS tablet Take 1 tablet by mouth daily at 12 noon.    Historical Provider, MD  trimethoprim-polymyxin b (POLYTRIM) ophthalmic solution Place 1 drop into the right eye every 6 (six) hours. X 7 days 08/23/14   Audelia Hives Javaughn Opdahl, PA   BP 112/72 mmHg  Pulse 66  Temp(Src) 98.3 F (36.8 C) (Oral)  Resp 12  SpO2 99%  LMP 07/23/2014  Breastfeeding? No Physical Exam  Constitutional: She is oriented to person, place, and time. She appears well-developed and well-nourished. No distress.  HENT:  Head: Normocephalic and atraumatic.  Eyes: EOM and lids are normal. Pupils are equal, round, and reactive to light. Right eye exhibits no chemosis, no discharge, no exudate and no hordeolum. No foreign body present in the right eye. Left eye exhibits no chemosis, no discharge, no exudate  and no hordeolum. No foreign body present in the left eye. Right conjunctiva is injected. Right conjunctiva has no hemorrhage. Left conjunctiva is not injected. Left conjunctiva has no hemorrhage. No scleral icterus.  Mild injection of left conjunctiva  Cardiovascular: Normal rate.   Pulmonary/Chest: Effort normal.  Musculoskeletal: Normal range of motion.  Neurological: She is alert and oriented to person, place, and time.  Skin: Skin is warm and dry.  Psychiatric: She has a normal mood and affect. Her behavior is normal.  Nursing note and vitals reviewed.   ED  Course  Procedures (including critical care time) Labs Review Labs Reviewed - No data to display  Imaging Review No results found.   MDM   1. Conjunctivitis of right eye   Polytrim opth as prescribed with follow up if no improvement.     Lutricia Feil, [place] 08/23/14 1335

## 2014-08-23 NOTE — ED Notes (Signed)
C/o  Right eye irritation since 4 /27.  Pt states that her husband has been ill and has the pink eye.  Pt did try her husband antibiotic eye drops a couple of times with no relief.

## 2016-11-17 ENCOUNTER — Telehealth: Payer: Self-pay | Admitting: General Practice

## 2016-11-17 DIAGNOSIS — N930 Postcoital and contact bleeding: Secondary | ICD-10-CM

## 2016-11-17 DIAGNOSIS — N921 Excessive and frequent menstruation with irregular cycle: Secondary | ICD-10-CM

## 2016-11-17 DIAGNOSIS — Z975 Presence of (intrauterine) contraceptive device: Principal | ICD-10-CM

## 2016-11-17 NOTE — Telephone Encounter (Signed)
-----   Message from Maurine Minister, [place] sent at 11/16/2016  9:28 AM EDT ----- Hi:  This patient had IUD (Mirena) placed about 3 years ago.  She feels like it's not in the right position. I have scheduled her on 12/16/16.  Not sure if she need [place] before this appointment.

## 2016-11-17 NOTE — Telephone Encounter (Signed)
Called patient and asked what symptoms she has having that she felt the IUD was not in the correct position. Patient states she has been having pain and bleeding after intercourse. Per Dr Nehemiah Settle, patient should have ultrasound. Scheduled for 7/27 @ 3pm and informed patient. Patient verbalized understanding & had no questions

## 2016-11-20 ENCOUNTER — Encounter (HOSPITAL_COMMUNITY): Payer: Self-pay

## 2016-11-20 ENCOUNTER — Ambulatory Visit (HOSPITAL_COMMUNITY)
Admission: RE | Admit: 2016-11-20 | Discharge: 2016-11-20 | Disposition: A | Discharge status: Home / Self Care | Payer: Medicaid Other | Source: Ambulatory Visit | Attending: Family Medicine | Admitting: Family Medicine

## 2016-11-20 DIAGNOSIS — Z975 Presence of (intrauterine) contraceptive device: Secondary | ICD-10-CM | POA: Insufficient documentation

## 2016-11-20 DIAGNOSIS — N930 Postcoital and contact bleeding: Secondary | ICD-10-CM

## 2016-11-20 DIAGNOSIS — N921 Excessive and frequent menstruation with irregular cycle: Secondary | ICD-10-CM

## 2016-11-23 ENCOUNTER — Telehealth: Payer: Self-pay | Admitting: *Deleted

## 2016-11-23 NOTE — Telephone Encounter (Signed)
I called Victoria Trujillo per Dr. Glenna Durand message and notified her that her IUD is in the right place , but she has a dermoid cyst. She states she had looked on my chart and saw the results. I informed her that her appointment would be changed from seeing a midwife to seeing an MD  And that the registrars will call her with the new appointment. She voices understanding.

## 2016-11-23 NOTE — Telephone Encounter (Signed)
-----   Message from Truett Mainland, DO sent at 11/20/2016  6:35 PM EDT ----- IUD in place. Has dermoid tumor. Should have appt with Gyn surgeon to discuss surgery

## 2016-11-25 ENCOUNTER — Telehealth: Payer: Self-pay | Admitting: General Practice

## 2016-11-25 NOTE — Telephone Encounter (Signed)
Called patient in regards of appointment on 12/16/16 at 3:20pm with Dr. Kennon Rounds.  Left message on VM.  Asked patient to call our office if she is unable to keep this appointment.

## 2016-12-16 ENCOUNTER — Encounter: Payer: Self-pay | Admitting: Family Medicine

## 2016-12-16 ENCOUNTER — Ambulatory Visit (INDEPENDENT_AMBULATORY_CARE_PROVIDER_SITE_OTHER): Payer: Medicaid Other | Admitting: Family Medicine

## 2016-12-16 ENCOUNTER — Ambulatory Visit: Payer: Self-pay | Admitting: Advanced Practice Midwife

## 2016-12-16 ENCOUNTER — Other Ambulatory Visit (HOSPITAL_COMMUNITY)
Admission: RE | Admit: 2016-12-16 | Discharge: 2016-12-16 | Disposition: A | Discharge status: Home / Self Care | Payer: Medicaid Other | Source: Ambulatory Visit | Attending: Family Medicine | Admitting: Family Medicine

## 2016-12-16 VITALS — BP 129/82 | HR 83 | Ht 65.0 in | Wt 225.0 lb

## 2016-12-16 DIAGNOSIS — Z124 Encounter for screening for malignant neoplasm of cervix: Secondary | ICD-10-CM | POA: Insufficient documentation

## 2016-12-16 DIAGNOSIS — D270 Benign neoplasm of right ovary: Secondary | ICD-10-CM

## 2016-12-16 DIAGNOSIS — Z308 Encounter for other contraceptive management: Secondary | ICD-10-CM

## 2016-12-16 DIAGNOSIS — Z3049 Encounter for surveillance of other contraceptives: Secondary | ICD-10-CM

## 2016-12-16 DIAGNOSIS — Z30432 Encounter for removal of intrauterine contraceptive device: Secondary | ICD-10-CM

## 2016-12-16 NOTE — Patient Instructions (Signed)
Preparing for Pregnancy If you are considering becoming pregnant, make an appointment to see your regular health care provider to learn how to prepare for a safe and healthy pregnancy (preconception care). During a preconception care visit, your health care provider will:  Do a complete physical exam, including a Pap test.  Take a complete medical history.  Give you information, answer your questions, and help you resolve problems.  Preconception checklist Medical history  Tell your health care provider about any current or past medical conditions. Your pregnancy or your ability to become pregnant may be affected by chronic conditions, such as diabetes, chronic hypertension, and thyroid problems.  Include your family's medical history as well as your partner's medical history.  Tell your health care provider about any history of STIs (sexually transmitted infections).These can affect your pregnancy. In some cases, they can be passed to your baby. Discuss any concerns that you have about STIs.  If indicated, discuss the benefits of genetic testing. This testing will show whether there are any genetic conditions that may be passed from you or your partner to your baby.  Tell your health care provider about: ? Any problems you have had with conception or pregnancy. ? Any medicines you take. These include vitamins, herbal supplements, and over-the-counter medicines. ? Your history of immunizations. Discuss any vaccinations that you may need.  Diet  Ask your health care provider what to include in a healthy diet that has a balance of nutrients. This is especially important when you are pregnant or preparing to become pregnant.  Ask your health care provider to help you reach a healthy weight before pregnancy. ? If you are overweight, you may be at higher risk for certain complications, such as high blood pressure, diabetes, and preterm birth. ? If you are underweight, you are more likely  to have a baby who has a low birth weight.  Lifestyle, work, and home  Let your health care provider know: ? About any lifestyle habits that you have, such as alcohol use, drug use, or smoking. ? About recreational activities that may put you at risk during pregnancy, such as downhill skiing and certain exercise programs. ? Tell your health care provider about any international travel, especially any travel to places with an active Zika virus outbreak. ? About harmful substances that you may be exposed to at work or at home. These include chemicals, pesticides, radiation, or even litter boxes. ? If you do not feel safe at home.  Mental health  Tell your health care provider about: ? Any history of mental health conditions, including feelings of depression, sadness, or anxiety. ? Any medicines that you take for a mental health condition. These include herbs and supplements.  Home instructions to prepare for pregnancy Lifestyle  Eat a balanced diet. This includes fresh fruits and vegetables, whole grains, lean meats, low-fat dairy products, healthy fats, and foods that are high in fiber. Ask to meet with a nutritionist or registered dietitian for assistance with meal planning and goals.  Get regular exercise. Try to be active for at least 30 minutes a day on most days of the week. Ask your health care provider which activities are safe during pregnancy.  Do not use any products that contain nicotine or tobacco, such as cigarettes and e-cigarettes. If you need help quitting, ask your health care provider.  Do not drink alcohol.  Do not take illegal drugs.  Maintain a healthy weight. Ask your health care provider what weight range is   right for you.  General instructions  Keep an accurate record of your menstrual periods. This makes it easier for your health care provider to determine your baby's due date.  Begin taking prenatal vitamins and folic acid supplements daily as directed by  your health care provider.  Manage any chronic conditions, such as high blood pressure and diabetes, as told by your health care provider. This is important.  How do I know that I am pregnant? You may be pregnant if you have been sexually active and you miss your period. Symptoms of early pregnancy include:  Mild cramping.  Very light vaginal bleeding (spotting).  Feeling unusually tired.  Nausea and vomiting (morning sickness).  If you have any of these symptoms and you suspect that you might be pregnant, you can take a home pregnancy test. These tests check for a hormone in your urine (human chorionic gonadotropin, or hCG). A woman's body begins to make this hormone during early pregnancy. These tests are very accurate. Wait until at least the first day after you miss your period to take one. If the test shows that you are pregnant (you get a positive result), call your health care provider to make an appointment for prenatal care. What should I do if I become pregnant?  Make an appointment with your health care provider as soon as you suspect you are pregnant.  Do not use any products that contain nicotine, such as cigarettes, chewing tobacco, and e-cigarettes. If you need help quitting, ask your health care provider.  Do not drink alcoholic beverages. Alcohol is related to a number of birth defects.  Avoid toxic odors and chemicals.  You may continue to have sexual intercourse if it does not cause pain or other problems, such as vaginal bleeding. This information is not intended to replace advice given to you by your health care provider. Make sure you discuss any questions you have with your health care provider. Document Released: 03/26/2008 Document Revised: 12/10/2015 Document Reviewed: 11/03/2015 Elsevier Interactive Patient Education  2017 [employer].

## 2016-12-16 NOTE — Progress Notes (Signed)
   Subjective:    Patient ID: Victoria Trujillo is a 31 y.o. female presenting with iud removal  on 12/16/2016  HPI: Patient having increasing pelvic pressure. Called in and got pelvic sonogram which showed IUD in place and right ovarian dermoid. Wants her IUD removed. Causes some irregular spotting. She is married and would like to have another child. IUD in x 4 years.  Review of Systems  Constitutional: Negative for chills and fever.  Respiratory: Negative for shortness of breath.   Cardiovascular: Negative for chest pain.  Gastrointestinal: Negative for abdominal pain, nausea and vomiting.  Genitourinary: Positive for pelvic pain. Negative for dysuria.  Skin: Negative for rash.      Objective:    BP 129/82   Pulse 83   Ht 5\' 5"  (1.651 m)   Wt 225 lb (102.1 kg)   LMP  (LMP Unknown)   BMI 37.44 kg/m  Physical Exam  Constitutional: She is oriented to person, place, and time. She appears well-developed and well-nourished. No distress.  HENT:  Head: Normocephalic and atraumatic.  Eyes: No scleral icterus.  Neck: Neck supple.  Cardiovascular: Normal rate.   Pulmonary/Chest: Effort normal.  Abdominal: Soft.  Genitourinary:  Genitourinary Comments: BUS normal, vagina is pink and rugated, cervix is multiparous without lesion, IUD strings noted    Neurological: She is alert and oriented to person, place, and time.  Skin: Skin is warm and dry.  Psychiatric: She has a normal mood and affect.   Procedure: Speculum placed inside vagina.  Cervix visualized.  Strings grasped with ring forceps.  IUD removed intact.     Assessment & Plan:   Problem List Items Addressed This Visit      Unprioritized   Dermoid cyst of ovary, right    For laparoscopic removal. Risks include but are not limited to bleeding, infection, injury to surrounding structures, including bowel, bladder and ureters, blood clots, and death.  Likelihood of success is high.        Other Visit Diagnoses    Pap  smear for cervical cancer screening    -  Primary   Relevant Orders   Cytology - PAP   Encounter for IUD removal       condoms until ovarian mass can be removed. Begin PNV's      Total face-to-face time with patient: 25 minutes. Over 50% of encounter was spent on counseling and coordination of care. Return if symptoms worsen or fail to improve.  Donnamae Jude 12/16/2016 3:47 PM

## 2016-12-16 NOTE — Assessment & Plan Note (Signed)
For laparoscopic removal. Risks include but are not limited to bleeding, infection, injury to surrounding structures, including bowel, bladder and ureters, blood clots, and death.  Likelihood of success is high.

## 2016-12-18 ENCOUNTER — Encounter (HOSPITAL_COMMUNITY): Payer: Self-pay

## 2016-12-18 ENCOUNTER — Encounter: Payer: Self-pay | Admitting: Family Medicine

## 2016-12-18 LAB — CYTOLOGY - PAP
Bacterial vaginitis: POSITIVE — AB
Candida vaginitis: NEGATIVE
Chlamydia: POSITIVE — AB
Diagnosis: NEGATIVE
Neisseria Gonorrhea: NEGATIVE
TRICH (WINDOWPATH): NEGATIVE

## 2016-12-21 ENCOUNTER — Other Ambulatory Visit: Payer: Self-pay | Admitting: *Deleted

## 2016-12-21 DIAGNOSIS — B9689 Other specified bacterial agents as the cause of diseases classified elsewhere: Secondary | ICD-10-CM

## 2016-12-21 DIAGNOSIS — A749 Chlamydial infection, unspecified: Secondary | ICD-10-CM

## 2016-12-21 DIAGNOSIS — N760 Acute vaginitis: Secondary | ICD-10-CM

## 2016-12-21 MED ORDER — METRONIDAZOLE 500 MG PO TABS: 500.0000 mg | ORAL_TABLET | Freq: Two times a day (BID) | ORAL | 0 refills | Status: DC

## 2016-12-21 MED ORDER — AZITHROMYCIN 1 G PO PACK: 1.0000 g | PACK | Freq: Once | ORAL | 0 refills | Status: AC

## 2016-12-21 NOTE — Progress Notes (Signed)
CCD form completed for health department.

## 2017-01-20 NOTE — H&P (Signed)
  Victoria Trujillo is an 31 y.o. [phone] female.   Chief Complaint: ovarian cyst HPI: Patient having increasing pelvic pressure. Called in and got pelvic sonogram which showed right ovarian dermoid.  Past Medical History:  Diagnosis Date  . Headache(784.0)   . Infection    UTI  . Ovarian cyst   . Pregnancy induced hypertension     Past Surgical History:  Procedure Laterality Date  . THERAPEUTIC ABORTION    . WISDOM TOOTH EXTRACTION      Family History  Problem Relation Age of Onset  . Hypertension Mother   . Diabetes Father   . Hypertension Father   . Heart disease Father   . Stroke Father   . Hypertension Maternal Grandmother   . Cancer Maternal Grandmother   . Kidney disease Maternal Grandfather    Social History:  reports that she has quit smoking. She has never used smokeless tobacco. She reports that she does not drink alcohol or use drugs.  Allergies: No Known Allergies  No current facility-administered medications on file prior to encounter.    Current Outpatient Prescriptions on File Prior to Encounter  Medication Sig Dispense Refill  . amoxicillin (AMOXIL) 500 MG capsule Take 2 capsules (1,000 mg total) by mouth 2 (two) times daily. (Patient not taking: Reported on 12/16/2016) 40 capsule 0  . antipyrine-benzocaine (AURALGAN) otic solution Place 3-4 drops into the left ear every 2 (two) hours as needed for ear pain. (Patient not taking: Reported on 12/16/2016) 10 mL 0  . clindamycin (CLEOCIN) 300 MG capsule Take 1 capsule (300 mg total) by mouth 3 (three) times daily. (Patient not taking: Reported on 12/16/2016) 21 capsule 0  . diclofenac (CATAFLAM) 50 MG tablet Take 1 tablet (50 mg total) by mouth 3 (three) times daily. (Patient not taking: Reported on 12/16/2016) 21 tablet 0  . Prenatal Vit-Fe Fumarate-FA (MULTIVITAMIN-PRENATAL) 27-0.8 MG TABS tablet Take 1 tablet by mouth daily at 12 noon.    . trimethoprim-polymyxin b (POLYTRIM) ophthalmic solution Place 1 drop into  the right eye every 6 (six) hours. X 7 days (Patient not taking: Reported on 12/16/2016) 10 mL 0    A comprehensive review of systems was negative.  There were no vitals taken for this visit. General appearance: alert, cooperative and appears stated age Head: Normocephalic, without obvious abnormality, atraumatic Neck: no adenopathy, supple, symmetrical, trachea midline and thyroid not enlarged, symmetric, no tenderness/mass/nodules Lungs: normal effort Heart: regular rate and rhythm Abdomen: soft, non-tender; bowel sounds normal; no masses,  no organomegaly Extremities: extremities normal, atraumatic, no cyanosis or edema Skin: Skin color, texture, turgor normal. No rashes or lesions Neurologic: Grossly normal   Lab Results  Component Value Date   WBC 9.8 06/21/2013   HGB 14.1 06/21/2013   HCT 41.5 06/21/2013   MCV 87.2 06/21/2013   PLT 255 06/21/2013   Lab Results  Component Value Date   PREGTESTUR NEGATIVE 07/27/2013     Assessment/Plan Principal Problem:   Dermoid cyst of ovary, right  For laparoscopic removal. Risks include but are not limited to bleeding, infection, injury to surrounding structures, including bowel, bladder and ureters, blood clots, and death.  Likelihood of success is high.    Donnamae Jude 01/20/2017, 6:55 PM

## 2017-01-25 NOTE — Patient Instructions (Signed)
Your procedure is scheduled on:  Tuesday, Oct. 9, 2018  Enter through the [employer] [facility] at:  8:15 AM  Pick up the phone at the desk and dial [phone].  Call this number if you have problems the morning of surgery: [phone].  Remember:  Do NOT eat food or drink after:  Midnight Monday  Take these medicines the morning of surgery with a SIP OF WATER:  None  Stop ALL herbal medications at this time  Do NOT smoke the day of surgery.  Do NOT wear jewelry (body piercing), metal hair clips/bobby pins, make-up, artifical eyelashes or nail polish. Do NOT wear lotions, powders, or perfumes.  You may wear deodorant. Do NOT shave for 48 hours prior to surgery. Do NOT bring valuables to the hospital. Contacts, dentures, or bridgework may not be worn into surgery.  Have a responsible adult drive you home and stay with you for 24 hours after your procedure  Bring a copy of your healthcare power of attorney and living will documents.

## 2017-01-26 ENCOUNTER — Encounter (HOSPITAL_COMMUNITY)
Admission: RE | Admit: 2017-01-26 | Discharge: 2017-01-26 | Disposition: A | Discharge status: Home / Self Care | Payer: Medicaid Other | Source: Ambulatory Visit | Attending: Family Medicine | Admitting: Family Medicine

## 2017-01-26 ENCOUNTER — Encounter (HOSPITAL_COMMUNITY): Payer: Self-pay

## 2017-01-26 DIAGNOSIS — D270 Benign neoplasm of right ovary: Secondary | ICD-10-CM | POA: Insufficient documentation

## 2017-01-26 DIAGNOSIS — Z833 Family history of diabetes mellitus: Secondary | ICD-10-CM | POA: Insufficient documentation

## 2017-01-26 DIAGNOSIS — Z87891 Personal history of nicotine dependence: Secondary | ICD-10-CM | POA: Insufficient documentation

## 2017-01-26 DIAGNOSIS — Z8249 Family history of ischemic heart disease and other diseases of the circulatory system: Secondary | ICD-10-CM | POA: Insufficient documentation

## 2017-01-26 DIAGNOSIS — Z823 Family history of stroke: Secondary | ICD-10-CM | POA: Insufficient documentation

## 2017-01-26 DIAGNOSIS — Z01812 Encounter for preprocedural laboratory examination: Secondary | ICD-10-CM | POA: Insufficient documentation

## 2017-01-26 LAB — CBC
HCT: 38.5 % (ref 36.0–46.0)
Hemoglobin: 12.7 g/dL (ref 12.0–15.0)
MCH: 29.1 pg (ref 26.0–34.0)
MCHC: 33 g/dL (ref 30.0–36.0)
MCV: 88.1 fL (ref 78.0–100.0)
Platelets: 348 10*3/uL (ref 150–400)
RBC: 4.37 MIL/uL (ref 3.87–5.11)
RDW: 13.2 % (ref 11.5–15.5)
WBC: 7.6 10*3/uL (ref 4.0–10.5)

## 2017-02-02 ENCOUNTER — Ambulatory Visit (HOSPITAL_COMMUNITY)
Admission: AD | Admit: 2017-02-02 | Discharge: 2017-02-02 | Disposition: A | Discharge status: Home / Self Care | Payer: Self-pay | Source: Ambulatory Visit | Attending: Family Medicine | Admitting: Family Medicine

## 2017-02-02 ENCOUNTER — Encounter (HOSPITAL_COMMUNITY)
Admission: AD | Disposition: A | Discharge status: Home / Self Care | Payer: Self-pay | Source: Ambulatory Visit | Attending: Family Medicine

## 2017-02-02 ENCOUNTER — Ambulatory Visit (HOSPITAL_COMMUNITY): Payer: Self-pay | Admitting: Anesthesiology

## 2017-02-02 ENCOUNTER — Encounter (HOSPITAL_COMMUNITY): Payer: Self-pay | Admitting: Emergency Medicine

## 2017-02-02 DIAGNOSIS — Z87891 Personal history of nicotine dependence: Secondary | ICD-10-CM | POA: Insufficient documentation

## 2017-02-02 DIAGNOSIS — N83202 Unspecified ovarian cyst, left side: Secondary | ICD-10-CM | POA: Insufficient documentation

## 2017-02-02 DIAGNOSIS — D270 Benign neoplasm of right ovary: Secondary | ICD-10-CM

## 2017-02-02 DIAGNOSIS — D3911 Neoplasm of uncertain behavior of right ovary: Secondary | ICD-10-CM | POA: Insufficient documentation

## 2017-02-02 HISTORY — PX: LAPAROSCOPIC OVARIAN CYSTECTOMY: SHX6248

## 2017-02-02 LAB — PREGNANCY, URINE: Preg Test, Ur: NEGATIVE

## 2017-02-02 SURGERY — EXCISION, CYST, OVARY, LAPAROSCOPIC
Anesthesia: General | Site: Abdomen | Laterality: Right

## 2017-02-02 MED ORDER — SUGAMMADEX SODIUM 500 MG/5ML IV SOLN
INTRAVENOUS | Status: DC | PRN
  Administered 2017-02-02: 200 mg via INTRAVENOUS

## 2017-02-02 MED ORDER — MEPERIDINE HCL 25 MG/ML IJ SOLN: 6.2500 mg | INTRAMUSCULAR | Status: DC | PRN

## 2017-02-02 MED ORDER — LIDOCAINE HCL (CARDIAC) 20 MG/ML IV SOLN
INTRAVENOUS | Status: DC | PRN
  Administered 2017-02-02: 100 mg via INTRAVENOUS

## 2017-02-02 MED ORDER — MIDAZOLAM HCL 2 MG/2ML IJ SOLN
INTRAMUSCULAR | Status: AC
  Filled 2017-02-02: qty 2

## 2017-02-02 MED ORDER — FENTANYL CITRATE (PF) 250 MCG/5ML IJ SOLN
INTRAMUSCULAR | Status: AC
  Filled 2017-02-02: qty 5

## 2017-02-02 MED ORDER — PROMETHAZINE HCL 25 MG/ML IJ SOLN: 6.2500 mg | INTRAMUSCULAR | Status: DC | PRN

## 2017-02-02 MED ORDER — SUGAMMADEX SODIUM 200 MG/2ML IV SOLN
INTRAVENOUS | Status: AC
  Filled 2017-02-02: qty 2

## 2017-02-02 MED ORDER — LACTATED RINGERS IV SOLN
INTRAVENOUS | Status: DC
  Administered 2017-02-02 (×2): via INTRAVENOUS

## 2017-02-02 MED ORDER — ONDANSETRON HCL 4 MG/2ML IJ SOLN
INTRAMUSCULAR | Status: AC
  Filled 2017-02-02: qty 2

## 2017-02-02 MED ORDER — MIDAZOLAM HCL 2 MG/2ML IJ SOLN
INTRAMUSCULAR | Status: DC | PRN
  Administered 2017-02-02: 2 mg via INTRAVENOUS

## 2017-02-02 MED ORDER — HYDROMORPHONE HCL 1 MG/ML IJ SOLN
INTRAMUSCULAR | Status: AC
  Administered 2017-02-02: 0.5 mg via INTRAVENOUS
  Filled 2017-02-02: qty 1

## 2017-02-02 MED ORDER — DEXAMETHASONE SODIUM PHOSPHATE 10 MG/ML IJ SOLN
INTRAMUSCULAR | Status: AC
  Filled 2017-02-02: qty 1

## 2017-02-02 MED ORDER — HYDROMORPHONE HCL 1 MG/ML IJ SOLN
0.2500 mg | INTRAMUSCULAR | Status: DC | PRN
  Administered 2017-02-02 (×3): 0.5 mg via INTRAVENOUS

## 2017-02-02 MED ORDER — HYDROMORPHONE HCL 1 MG/ML IJ SOLN
INTRAMUSCULAR | Status: AC
  Filled 2017-02-02: qty 1

## 2017-02-02 MED ORDER — PROPOFOL 10 MG/ML IV BOLUS
INTRAVENOUS | Status: DC | PRN
  Administered 2017-02-02: 150 mg via INTRAVENOUS

## 2017-02-02 MED ORDER — ONDANSETRON HCL 4 MG/2ML IJ SOLN
INTRAMUSCULAR | Status: DC | PRN
  Administered 2017-02-02: 4 mg via INTRAVENOUS

## 2017-02-02 MED ORDER — SCOPOLAMINE 1 MG/3DAYS TD PT72
MEDICATED_PATCH | TRANSDERMAL | Status: AC
  Administered 2017-02-02: 1.5 mg via TRANSDERMAL
  Filled 2017-02-02: qty 1

## 2017-02-02 MED ORDER — BUPIVACAINE HCL (PF) 0.25 % IJ SOLN
INTRAMUSCULAR | Status: AC
  Filled 2017-02-02: qty 30

## 2017-02-02 MED ORDER — OXYCODONE HCL 5 MG PO TABS: 5.0000 mg | ORAL_TABLET | Freq: Once | ORAL | Status: DC | PRN

## 2017-02-02 MED ORDER — SODIUM CHLORIDE 0.9 % IR SOLN
Status: DC | PRN
  Administered 2017-02-02: 3000 mL

## 2017-02-02 MED ORDER — OXYCODONE-ACETAMINOPHEN 5-325 MG PO TABS: 1.0000 | ORAL_TABLET | Freq: Four times a day (QID) | ORAL | 0 refills | Status: DC | PRN

## 2017-02-02 MED ORDER — FENTANYL CITRATE (PF) 100 MCG/2ML IJ SOLN
INTRAMUSCULAR | Status: DC | PRN
  Administered 2017-02-02: 100 ug via INTRAVENOUS
  Administered 2017-02-02 (×3): 50 ug via INTRAVENOUS

## 2017-02-02 MED ORDER — OXYCODONE HCL 5 MG/5ML PO SOLN
5.0000 mg | Freq: Once | ORAL | Status: DC | PRN
Start: 2017-02-02 — End: 2017-02-02

## 2017-02-02 MED ORDER — LIDOCAINE HCL (CARDIAC) 20 MG/ML IV SOLN
INTRAVENOUS | Status: AC
  Filled 2017-02-02: qty 5

## 2017-02-02 MED ORDER — DEXAMETHASONE SODIUM PHOSPHATE 10 MG/ML IJ SOLN
INTRAMUSCULAR | Status: DC | PRN
  Administered 2017-02-02: 10 mg via INTRAVENOUS

## 2017-02-02 MED ORDER — PROPOFOL 10 MG/ML IV BOLUS
INTRAVENOUS | Status: AC
  Filled 2017-02-02: qty 20

## 2017-02-02 MED ORDER — ROCURONIUM BROMIDE 100 MG/10ML IV SOLN
INTRAVENOUS | Status: DC | PRN
  Administered 2017-02-02: 50 mg via INTRAVENOUS

## 2017-02-02 MED ORDER — SCOPOLAMINE 1 MG/3DAYS TD PT72
1.0000 | MEDICATED_PATCH | Freq: Once | TRANSDERMAL | Status: DC
Start: 2017-02-02 — End: 2017-02-02
  Administered 2017-02-02: 1.5 mg via TRANSDERMAL

## 2017-02-02 MED ORDER — BUPIVACAINE HCL (PF) 0.25 % IJ SOLN
INTRAMUSCULAR | Status: DC | PRN
  Administered 2017-02-02: 24 mL

## 2017-02-02 SURGICAL SUPPLY — 27 items
BAG SPEC RTRVL LRG 6X4 10 (ENDOMECHANICALS) ×1
BLADE SURG 15 STRL LF C SS BP (BLADE) ×1 IMPLANT
BLADE SURG 15 STRL SS (BLADE) ×3
CABLE HIGH FREQUENCY MONO STRZ (ELECTRODE) IMPLANT
CATH ROBINSON RED A/P 16FR (CATHETERS) IMPLANT
CLOTH BEACON ORANGE TIMEOUT ST (SAFETY) ×3 IMPLANT
DRSG OPSITE POSTOP 3X4 (GAUZE/BANDAGES/DRESSINGS) ×2 IMPLANT
DURAPREP 26ML APPLICATOR (WOUND CARE) ×3 IMPLANT
GLOVE BIOGEL PI IND STRL 7.0 (GLOVE) ×4 IMPLANT
GLOVE BIOGEL PI INDICATOR 7.0 (GLOVE) ×8
GLOVE ECLIPSE 7.0 STRL STRAW (GLOVE) ×3 IMPLANT
GOWN STRL REUS W/TWL LRG LVL3 (GOWN DISPOSABLE) ×9 IMPLANT
NS IRRIG 1000ML POUR BTL (IV SOLUTION) ×3 IMPLANT
PACK LAPAROSCOPY BASIN (CUSTOM PROCEDURE TRAY) ×3 IMPLANT
PACK TRENDGUARD 450 HYBRID PRO (MISCELLANEOUS) IMPLANT
POUCH SPECIMEN RETRIEVAL 10MM (ENDOMECHANICALS) ×2 IMPLANT
PROTECTOR NERVE ULNAR (MISCELLANEOUS) ×6 IMPLANT
SET IRRIG TUBING LAPAROSCOPIC (IRRIGATION / IRRIGATOR) ×2 IMPLANT
SHEARS HARMONIC ACE PLUS 36CM (ENDOMECHANICALS) ×2 IMPLANT
SUT VIC AB 3-0 X1 27 (SUTURE) ×3 IMPLANT
SUT VICRYL 0 UR6 27IN ABS (SUTURE) ×6 IMPLANT
TOWEL OR 17X24 6PK STRL BLUE (TOWEL DISPOSABLE) ×6 IMPLANT
TRAY FOLEY CATH SILVER 14FR (SET/KITS/TRAYS/PACK) ×3 IMPLANT
TRENDGUARD 450 HYBRID PRO PACK (MISCELLANEOUS) ×3
TROCAR BALLN 12MMX100 BLUNT (TROCAR) ×2 IMPLANT
TROCAR OPTI TIP 5M 100M (ENDOMECHANICALS) ×6 IMPLANT
WARMER LAPAROSCOPE (MISCELLANEOUS) ×3 IMPLANT

## 2017-02-02 NOTE — Discharge Instructions (Signed)
DISCHARGE INSTRUCTIONS: Laparoscopy ° °The following instructions have been prepared to help you care for yourself upon your return home today. ° °Wound care: °• Do not get the incision wet for the first 24 hours. The incision should be kept clean and dry. °• The Band-Aids or dressings may be removed the day after surgery. °• Should the incision become sore, red, and swollen after the first week, check with your doctor. ° °Personal hygiene: °• Shower the day after your procedure. ° °Activity and limitations: °• Do NOT drive or operate any equipment today. °• Do NOT lift anything more than 15 pounds for 2-3 weeks after surgery. °• Do NOT rest in bed all day. °• Walking is encouraged. Walk each day, starting slowly with 5-minute walks 3 or 4 times a day. Slowly increase the length of your walks. °• Walk up and down stairs slowly. °• Do NOT do strenuous activities, such as golfing, playing tennis, bowling, running, biking, weight lifting, gardening, mowing, or vacuuming for 2-4 weeks. Ask your doctor when it is okay to start. ° °Diet: Eat a light meal as desired this evening. You may resume your usual diet tomorrow. ° °Return to work: This is dependent on the type of work you do. For the most part you can return to a desk job within a week of surgery. If you are more active at work, please discuss this with your doctor. ° °What to expect after your surgery: You may have a slight burning sensation when you urinate on the first day. You may have a very small amount of blood in the urine. Expect to have a small amount of vaginal discharge/light bleeding for 1-2 weeks. It is not unusual to have abdominal soreness and bruising for up to 2 weeks. You may be tired and need more rest for about 1 week. You may experience shoulder pain for 24-72 hours. Lying flat in bed may relieve it. ° °Call your doctor for any of the following: °• Develop a fever of 100.4 or greater °• Inability to urinate 6 hours after discharge from  hospital °• Severe pain not relieved by pain medications °• Persistent of heavy bleeding at incision site °• Redness or swelling around incision site after a week °• Increasing nausea or vomiting ° °Patient Signature________________________________________ °Nurse Signature_________________________________________ °Post Anesthesia Home Care Instructions ° °Activity: °Get plenty of rest for the remainder of the day. A responsible individual must stay with you for 24 hours following the procedure.  °For the next 24 hours, DO NOT: °-Drive a car °-Operate machinery °-Drink alcoholic beverages °-Take any medication unless instructed by your physician °-Make any legal decisions or sign important papers. ° °Meals: °Start with liquid foods such as gelatin or soup. Progress to regular foods as tolerated. Avoid greasy, spicy, heavy foods. If nausea and/or vomiting occur, drink only clear liquids until the nausea and/or vomiting subsides. Call your physician if vomiting continues. ° °Special Instructions/Symptoms: °Your throat may feel dry or sore from the anesthesia or the breathing tube placed in your throat during surgery. If this causes discomfort, gargle with warm salt water. The discomfort should disappear within 24 hours. ° °If you had a scopolamine patch placed behind your ear for the management of post- operative nausea and/or vomiting: ° °1. The medication in the patch is effective for 72 hours, after which it should be removed.  Wrap patch in a tissue and discard in the trash. Wash hands thoroughly with soap and water. °2. You may remove the patch   earlier than 72 hours if you experience unpleasant side effects which may include dry mouth, dizziness or visual disturbances. 3. Avoid touching the patch. Wash your hands with soap and water after contact with the patch.   Ovarian Cystectomy Ovarian cystectomy is surgery to remove a fluid-filled sac (cyst) on an ovary. The ovaries are small organs that produce eggs in  women. Various types of cysts can form on the ovaries. Most are not cancerous. Surgery may be done if a cyst is large or is causing symptoms such as pain. It may also be done for a cyst that is or might be cancerous. This surgery can be done using a laparoscopic technique or an open abdominal technique. The laparoscopic technique involves smaller cuts (incisions) and a faster recovery time. The technique used will depend on your age, the type of cyst, and whether the cyst is cancerous. The laparoscopic technique is not used for a cancerous cyst. LET [facility] CARE PROVIDER KNOW ABOUT:  Any allergies you have.  All medicines you are taking, including vitamins, herbs, eye drops, creams, and over-the-counter medicines.  Previous problems you or members of your family have had with the use of anesthetics.  Any blood disorders you have.  Previous surgeries you have had.  Medical conditions you have.  Any chance you might be pregnant. RISKS AND COMPLICATIONS Generally, this is a safe procedure. However, as with any procedure, complications can occur. Possible complications include:  Excessive bleeding.  Infection.  Injury to other organs.  Blood clots.  Becoming incapable of getting pregnant (infertile).  BEFORE THE PROCEDURE  Ask your health care provider about changing or stopping any regular medicines. Avoid taking aspirin, ibuprofen, or blood thinners as directed by your health care provider.  Do not eat or drink anything after midnight the night before surgery.  If you smoke, do not smoke for at least 2 weeks before your surgery.  Do not drink alcohol the day before your surgery.  Let your health care provider know if you develop a cold or any infection before your surgery.  Arrange for someone to drive you home after the procedure or after your hospital stay. Also arrange for someone to help you with activities during recovery. PROCEDURE Either a laparoscopic technique  or an open abdominal technique may be used for this surgery.  Small monitors will be put on your body. They are used to check your heart, blood pressure, and oxygen level.  An IV access tube will be put into one of your veins. Medicine will be able to flow directly into your body through this IV tube.  You might be given a medicine to help you relax (sedative).  You will be given a medicine to make you sleep (general anesthetic). A breathing tube may be placed into your lungs during the procedure.  Laparoscopic Technique  Several small cuts (incisions) are made in your abdomen. These are typically about 1 to 2 cm long.  Your abdomen will be filled with carbon dioxide gas so that it expands. This gives the surgeon more room to operate and makes your organs easier to see.  A thin, lighted tube with a tiny camera on the end (laparoscope) is put through one of the small incisions. The camera on the laparoscope sends a picture to a TV screen in the operating room. This gives the surgeon a good view inside your abdomen.  Hollow tubes are put through the other small incisions in your abdomen. The tools needed for the  procedure are put through these tubes.  The ovary with the cyst is identified, and the cyst is removed. It is sent to the lab for testing. If it is cancer, both ovaries may need to be removed during a different surgery.  Tools are removed. The incisions are then closed with stitches or skin glue, and dressings may be applied. Open Abdominal Technique  A single large incision is made along your bikini line or in the middle of your lower abdomen.  The ovary with the cyst is identified, and the cyst is removed. It is sent to the lab for testing. If it is cancer, both ovaries may need to be removed during a different surgery.  The incision is then closed with stitches or staples. What to expect after the procedure  You will wake up from anesthesia and be taken to a recovery  area.  If you had laparoscopic surgery, you may be able to go home the same day, or you may need to stay in the hospital overnight.  If you had open abdominal surgery, you will need to stay in the hospital for a few days.  Your IV access tube and catheter will be removed the first or second day, after you are able to eat and drink enough.  You may be given medicine to relieve pain or to help you sleep.  You may be given an antibiotic medicine if needed. This information is not intended to replace advice given to you by your health care provider. Make sure you discuss any questions you have with your health care provider. Document Released: 02/08/2007 Document Revised: 09/19/2015 Document Reviewed: 11/23/2012 Elsevier Interactive Patient Education  2017 [employer].

## 2017-02-02 NOTE — Anesthesia Preprocedure Evaluation (Signed)
Anesthesia Evaluation  Patient identified by MRN, date of birth, ID band Patient awake    Reviewed: Allergy & Precautions, H&P , NPO status , Patient's Chart, lab work & pertinent test results  Airway Mallampati: III  TM Distance: >3 FB Neck ROM: Full    Dental  (+) Teeth Intact   Pulmonary neg pulmonary ROS, neg shortness of breath, neg sleep apnea, neg COPD, neg recent URI, former smoker,    Pulmonary exam normal        Cardiovascular hypertension, negative cardio ROS   Rhythm:Regular Rate:Normal     Neuro/Psych  Headaches, negative neurological ROS  negative psych ROS   GI/Hepatic negative GI ROS, Neg liver ROS,   Endo/Other  negative endocrine ROS  Renal/GU negative Renal ROS  negative genitourinary   Musculoskeletal negative musculoskeletal ROS (+)   Abdominal   Peds negative pediatric ROS (+)  Hematology negative hematology ROS (+)   Anesthesia Other Findings   Reproductive/Obstetrics negative OB ROS                             Anesthesia Physical  Anesthesia Plan  ASA: II  Anesthesia Plan: General   Post-op Pain Management:    Induction: Intravenous  PONV Risk Score and Plan: 3 and Ondansetron, Dexamethasone and Midazolam  Airway Management Planned: Natural Airway and Oral ETT  Additional Equipment: None  Intra-op Plan:   Post-operative Plan: Extubation in OR  Informed Consent: I have reviewed the patients History and Physical, chart, labs and discussed the procedure including the risks, benefits and alternatives for the proposed anesthesia with the patient or authorized representative who has indicated his/her understanding and acceptance.   Dental advisory given  Plan Discussed with: Anesthesiologist  Anesthesia Plan Comments:         Anesthesia Quick Evaluation

## 2017-02-02 NOTE — Interval H&P Note (Signed)
History and Physical Interval Note:  87/04/9595 4:71 AM  Victoria Trujillo  has presented today for surgery, with the diagnosis of Right Dermoid cyst  The various methods of treatment have been discussed with the patient and family. After consideration of risks, benefits and other options for treatment, the patient has consented to  Procedure(s): LAPAROSCOPIC OVARIAN CYSTECTOMY (Right) as a surgical intervention .  The patient's history has been reviewed, patient examined, no change in status, stable for surgery.  I have reviewed the patient's chart and labs.  Questions were answered to the patient's satisfaction.     Donnamae Jude

## 2017-02-02 NOTE — Anesthesia Procedure Notes (Signed)
Procedure Name: Intubation Date/Time: 02/02/2017 9:52 AM Performed by: Manus Gunning, Niketa Turner J Pre-anesthesia Checklist: Patient identified, Emergency Drugs available, Suction available, Patient being monitored and Timeout performed Patient Re-evaluated:Patient Re-evaluated prior to induction Oxygen Delivery Method: Circle system utilized Preoxygenation: Pre-oxygenation with 100% oxygen Induction Type: IV induction Ventilation: Mask ventilation without difficulty Laryngoscope Size: Mac and 3 Grade View: Grade I Tube type: Oral Tube size: 7.0 mm Number of attempts: 1 Airway Equipment and Method: Stylet Placement Confirmation: ETT inserted through vocal cords under direct vision,  positive ETCO2 and breath sounds checked- equal and bilateral Secured at: 21 cm Tube secured with: Tape Dental Injury: Teeth and Oropharynx as per pre-operative assessment

## 2017-02-02 NOTE — Anesthesia Postprocedure Evaluation (Signed)
Anesthesia Post Note  Patient: Victoria Trujillo  Procedure(s) Performed: LAPAROSCOPIC OVARIAN CYSTECTOMY (Right Abdomen)     Patient location during evaluation: PACU Anesthesia Type: General Level of consciousness: awake and alert Pain management: pain level controlled Vital Signs Assessment: post-procedure vital signs reviewed and stable Respiratory status: spontaneous breathing, nonlabored ventilation, respiratory function stable and patient connected to nasal cannula oxygen Cardiovascular status: blood pressure returned to baseline and stable Postop Assessment: no apparent nausea or vomiting Anesthetic complications: no    Last Vitals:  Vitals:   02/02/17 1215 02/02/17 1230  BP: 125/77 120/82  Pulse: 85 87  Resp: 17 17  Temp:  37.1 C  SpO2: 93% 95%    Last Pain:  Vitals:   02/02/17 1230  TempSrc:   PainSc: 4    Pain Goal: Patients Stated Pain Goal: 3 (02/02/17 1230)               Audry Pili

## 2017-02-02 NOTE — Transfer of Care (Signed)
Immediate Anesthesia Transfer of Care Note  Patient: Victoria Trujillo  Procedure(s) Performed: LAPAROSCOPIC OVARIAN CYSTECTOMY (Right Abdomen)  Patient Location: PACU  Anesthesia Type:General  Level of Consciousness: awake  Airway & Oxygen Therapy: Patient Spontanous Breathing and Patient connected to face mask oxygen  Post-op Assessment: Report given to RN and Post -op Vital signs reviewed and stable  Post vital signs: Reviewed and stable  Last Vitals:  Vitals:   02/02/17 0827  BP: 128/75  Pulse: 83  Resp: 15  Temp: 37 C  SpO2: 100%    Last Pain:  Vitals:   02/02/17 0827  TempSrc: Oral      Patients Stated Pain Goal: 3 (04/14/74 8832)  Complications: No apparent anesthesia complications

## 2017-02-02 NOTE — Op Note (Signed)
PROCEDURE DATE: 02/02/2017  PREOPERATIVE DIAGNOSES: Right ovarian cyst presumed dermoid  POSTOPERATIVE DIAGNOSES: The same, simple left ovarian cyst   PROCEDURE: Laparoscopic right ovarian cystectomy, left ovarian cystotomy  SURGEON: Dr. Donnamae Jude   ASSISTANT: Dr. Mora Bellman  ANESTHESIOLOGIST: Audry Pili, MD MD - GETT  INDICATIONS: 31 y.o. [phone] with history of pelvic pain found to have a 7.6 cm right ovarian dermoid who needed removal  FINDINGS: enlarged right ovary, normal uterus, bilateral tubes, small simple cyst on left ovary   ESTIMATED BLOOD LOSS: 100 ml   SPECIMENS: right ovarian cyst  COMPLICATIONS: None immediately known   PROCEDURE IN DETAIL: The patient received  had sequential compression devices applied to her lower extremities while in the preoperative area. She was then taken to the operating room where general anesthesia was administered and was found to be adequate. She was placed in the dorsal lithotomy position, and was prepped and draped in a sterile manner. A Foley catheter was inserted into her bladder and attached to constant drainage and a speculum placed. Cervix grasped with a single tooth tenaculum and a Hulka tenaculum placed. Speculum and tenaculum removed from the vagina.  After an adequate timeout was performed, attention was then turned to the patient's abdomen where a 11-mm skin incision was made in the umbilicus. This was carried down to the underlying fascia and peritoneum.  The fascia was tagged with 0 Vicryl suture on a UR-6 in a purse string fashion.. Intraperitoneal placement was confirmed and insufflation done. A survey of the patient's pelvis and abdomen revealed the findings above. Bilateral 5-mm lower quadrant ports were then placed under direct visualization. On the left side, the ovary was inspected and a simple cyst incised with the Harmonic device. On the right side, the enlarged ovary was found. A second 5 mm port placed on the  left and camera switched to the 5 and the EndoCatch bag placed inside the abdomen. The ovary could not be placed completely inside the bag. The Harmonic scalpel was used to make a linear incision in the ovarian parenchyma and blunt dissection was used to remove cyst from the ovary until the base which was separated using the Harmonic device. During this attempt at cyst removal the cyst ruptured and thick greasy material escaped into the peritoneal cavity. The specimen was then removed from the abdomen through the 11-mm port using an Endocatch bag, under direct visualization. The Suction/Irrigator was used to remove the cyst contents and copiously irrigate the peritoneal cavity. The operative site was surveyed, and found to be hemostatic. No intraoperative injury to other surrounding organs was noted. The abdomen was desufflated and all instruments were then removed from the patient's abdomen. The fascia was closed with the purse string Vicryl placed at beginning of the procedure.  All skin incisions were closed with 3-0 Vicryl subcuticular stitches/Dermabond.   Donnamae Jude MD 02/02/2017 11:10 AM

## 2017-02-03 ENCOUNTER — Encounter (HOSPITAL_COMMUNITY): Payer: Self-pay | Admitting: Family Medicine

## 2017-02-11 ENCOUNTER — Encounter: Payer: Self-pay | Admitting: Family Medicine

## 2017-02-11 ENCOUNTER — Other Ambulatory Visit (HOSPITAL_COMMUNITY): Payer: Self-pay | Admitting: Family Medicine

## 2017-02-11 ENCOUNTER — Other Ambulatory Visit: Payer: Self-pay | Admitting: General Practice

## 2017-02-11 DIAGNOSIS — G8918 Other acute postprocedural pain: Secondary | ICD-10-CM

## 2017-02-11 MED ORDER — IBUPROFEN 800 MG PO TABS: 800.0000 mg | ORAL_TABLET | Freq: Three times a day (TID) | ORAL | 0 refills | Status: DC | PRN

## 2017-02-26 ENCOUNTER — Ambulatory Visit (INDEPENDENT_AMBULATORY_CARE_PROVIDER_SITE_OTHER): Payer: Self-pay | Admitting: Family Medicine

## 2017-02-26 ENCOUNTER — Encounter: Payer: Self-pay | Admitting: Family Medicine

## 2017-02-26 VITALS — BP 118/67 | HR 78 | Temp 98.4°F | Wt 219.7 lb

## 2017-02-26 DIAGNOSIS — Z9889 Other specified postprocedural states: Secondary | ICD-10-CM

## 2017-02-26 NOTE — Progress Notes (Signed)
   Subjective:    Patient ID: Victoria Trujillo is a 31 y.o. female presenting with No chief complaint on file.  on 02/26/2017  HPI: S/p removal of ovarian dermoid on 10/9. Here for post op check. Doing well. Pain from cyst is resolved. Notes some leaking at her umbilicus.  Review of Systems  Constitutional: Negative for chills and fever.  Respiratory: Negative for shortness of breath.   Cardiovascular: Negative for chest pain.  Gastrointestinal: Negative for abdominal pain, nausea and vomiting.  Genitourinary: Negative for dysuria.  Skin: Negative for rash.      Objective:    BP 118/67   Pulse 78   Temp 98.4 F (36.9 C)   Wt 219 lb 11.2 oz (99.7 kg)   LMP 02/08/2017   BMI 36.56 kg/m  Physical Exam  Constitutional: She is oriented to person, place, and time. She appears well-developed and well-nourished. No distress.  HENT:  Head: Normocephalic and atraumatic.  Eyes: No scleral icterus.  Neck: Neck supple.  Cardiovascular: Normal rate.   Pulmonary/Chest: Effort normal.  Abdominal: Soft.  Neurological: She is alert and oriented to person, place, and time.  Skin: Skin is warm and dry.  Glue noted on 5 mm ports. Umbilicus with small sub-centimeter opening treated with AgNO3  Psychiatric: She has a normal mood and affect.        Assessment & Plan:   Post-operative state - doing well--f/u prn. Pap due 2021. Does not desire contraception at this time.  Return if symptoms worsen or fail to improve.  Donnamae Jude 02/26/2017 9:04 AM

## 2017-04-24 ENCOUNTER — Ambulatory Visit (HOSPITAL_COMMUNITY)
Admission: EM | Admit: 2017-04-24 | Discharge: 2017-04-24 | Disposition: A | Discharge status: Home / Self Care | Payer: Medicaid Other | Attending: Family Medicine | Admitting: Family Medicine

## 2017-04-24 ENCOUNTER — Other Ambulatory Visit: Payer: Self-pay

## 2017-04-24 ENCOUNTER — Encounter (HOSPITAL_COMMUNITY): Payer: Self-pay | Admitting: Emergency Medicine

## 2017-04-24 DIAGNOSIS — Z113 Encounter for screening for infections with a predominantly sexual mode of transmission: Secondary | ICD-10-CM | POA: Diagnosis not present

## 2017-04-24 DIAGNOSIS — Z202 Contact with and (suspected) exposure to infections with a predominantly sexual mode of transmission: Secondary | ICD-10-CM | POA: Diagnosis not present

## 2017-04-24 DIAGNOSIS — Z87891 Personal history of nicotine dependence: Secondary | ICD-10-CM | POA: Insufficient documentation

## 2017-04-24 MED ORDER — AZITHROMYCIN 250 MG PO TABS
ORAL_TABLET | ORAL | Status: AC
  Filled 2017-04-24: qty 4

## 2017-04-24 MED ORDER — CEFTRIAXONE SODIUM 250 MG IJ SOLR
INTRAMUSCULAR | Status: AC
  Filled 2017-04-24: qty 250

## 2017-04-24 MED ORDER — CEFTRIAXONE SODIUM 250 MG IJ SOLR
250.0000 mg | Freq: Once | INTRAMUSCULAR | Status: AC
  Administered 2017-04-24: 250 mg via INTRAMUSCULAR

## 2017-04-24 MED ORDER — AZITHROMYCIN 250 MG PO TABS
1000.0000 mg | ORAL_TABLET | Freq: Once | ORAL | Status: AC
  Administered 2017-04-24: 1000 mg via ORAL

## 2017-04-24 NOTE — ED Triage Notes (Signed)
Pt states her husband tested positive for gonorrhea and wants to be checked and treated for the same. Denies symptoms.

## 2017-04-24 NOTE — ED Provider Notes (Signed)
  [facility]   [id number] 04/24/17 Arrival Time: 1749  ASSESSMENT & PLAN:  1. STD exposure     Meds ordered this encounter  Medications  . azithromycin (ZITHROMAX) tablet 1,000 mg  . cefTRIAXone (ROCEPHIN) injection 250 mg    Order Specific Question:   Antibiotic Indication:    Answer:   STD   Urine cytology sent. Will notify of any positive results. Instructed to refrain from sexual activity for at least seven days.  Reviewed expectations re: course of current medical issues. Questions answered. Outlined signs and symptoms indicating need for more acute intervention. Patient verbalized understanding. After Visit Summary given.   SUBJECTIVE:  Victoria Trujillo is a 31 y.o. female who presents with complaint of exposure to gonorrhea - husband tested positive. She is without symptoms. Urinary symptoms: none. Afebrile. No abdominal or pelvic pain. No n/v. No rashes or lesions. Sexually active with single female partner. Desires testing.  ROS: As per HPI.  OBJECTIVE:  Vitals:   04/24/17 1237  BP: 119/80  Pulse: 93  Resp: 16  Temp: 98.5 F (36.9 C)  SpO2: 98%    General appearance: alert, cooperative, appears stated age and no distress Throat: lips, mucosa, and tongue normal; teeth and gums normal Back: no CVA tenderness Abdomen: soft, non-tender; bowel sounds normal; no masses or organomegaly; no guarding or rebound tenderness GU: declines Skin: warm and dry Psychological:  Alert and cooperative. Normal mood and affect.  Labs Reviewed  URINE CYTOLOGY ANCILLARY ONLY    No Known Allergies  Past Medical History:  Diagnosis Date  . Headache(784.0)   . Infection    UTI  . Ovarian cyst   . Pregnancy induced hypertension    Family History  Problem Relation Age of Onset  . Hypertension Mother   . Diabetes Father   . Hypertension Father   . Heart disease Father   . Stroke Father   . Hypertension Maternal Grandmother   . Cancer Maternal Grandmother     . Kidney disease Maternal Grandfather    Social History   Socioeconomic History  . Marital status: Married    Spouse name: Not on file  . Number of children: Not on file  . Years of education: Not on file  . Highest education level: Not on file  Social Needs  . Financial resource strain: Not on file  . Food insecurity - worry: Not on file  . Food insecurity - inability: Not on file  . Transportation needs - medical: Not on file  . Transportation needs - non-medical: Not on file  Occupational History  . Not on file  Tobacco Use  . Smoking status: Former Research scientist (life sciences)  . Smokeless tobacco: Never Used  . Tobacco comment: not daily  Substance and Sexual Activity  . Alcohol use: Yes  . Drug use: No  . Sexual activity: Yes  Other Topics Concern  . Not on file  Social History Narrative  . Not on file          Vanessa Kick, MD 04/24/17 1246

## 2017-04-24 NOTE — Discharge Instructions (Signed)
You have been given the following medications today for treatment of suspected gonorrhea and/or chlamydia:  cefTRIAXone (ROCEPHIN) injection 250 mg azithromycin (ZITHROMAX) tablet 1,000 mg  Even though we have treated you today, we have sent testing for sexually transmitted infections. We will notify you of any positive results once they are received. If required, we will prescribe any medications you might need.

## 2017-04-29 LAB — URINE CYTOLOGY ANCILLARY ONLY
Candida vaginitis: NEGATIVE
Chlamydia: NEGATIVE
Neisseria Gonorrhea: NEGATIVE
TRICH (WINDOWPATH): NEGATIVE

## 2018-08-08 ENCOUNTER — Telehealth: Payer: Medicaid Other | Admitting: Family

## 2018-08-08 DIAGNOSIS — Z3490 Encounter for supervision of normal pregnancy, unspecified, unspecified trimester: Secondary | ICD-10-CM

## 2018-08-08 DIAGNOSIS — R112 Nausea with vomiting, unspecified: Secondary | ICD-10-CM

## 2018-08-08 DIAGNOSIS — G43809 Other migraine, not intractable, without status migrainosus: Secondary | ICD-10-CM

## 2018-08-08 NOTE — Progress Notes (Signed)
Based on what you shared with me, I feel your condition warrants further evaluation and I recommend that you be seen for a face to face office visit.  Based on your symptoms, I feel it would be best to call your gyn office and discuss your symptoms.   NOTE: If you entered your credit card information for this eVisit, you will not be charged. You may see a "hold" on your card for the $35 but that hold will drop off and you will not have a charge processed.  If you are having a true medical emergency please call 911.  If you need an urgent face to face visit, [facility] has four urgent care centers for your convenience.    PLEASE NOTE: THE [facility]STACARE LOCATIONS AND URGENT CARE CLINICS DO NOT HAVE THE TESTING FOR CORONAVIRUS COVID19 AVAILABLE.  IF YOU FEEL YOU NEED THIS TEST YOU MUST GO TO A TRIAGE LOCATION AT [facility]   [email or web address] to reserve your spot online an avoid wait times  [facility] [address], [address] [place], [place] [address] Modified hours of operation: Monday-Friday, 10 AM to 6 PM  Saturday & Sunday 10 AM to 4 PM *Across the street from [place] (New Address!) [address] [place], [place] [address] *Just off [address], across the road from [place] hours of operation: Monday-Friday, 10 AM to 5 PM  Closed Saturday & Sunday   The following sites will take your insurance:  . [facility] [place] a Provider at this Location  [address] [place], [place] [address] . 10 am to 8 pm Monday-Friday . 12 pm to 8 pm Saturday-Sunday   . [facility] [place] a Provider at this Location  [address] [place], [place], [place] [address] . 8 am to 8 pm Monday-Friday . 9 am to 6 pm Saturday . 11 am to 6 pm Sunday    . [facility] at [place] Get Driving Directions  [address] [place], [place] [address] . 8 am to 8 pm Monday-Friday . 8 am to 4 pm Saturday-Sunday   Your e-visit answers were reviewed by a board certified advanced clinical practitioner to complete your personal care plan.  Thank you for using e-Visits.

## 2018-08-10 ENCOUNTER — Telehealth: Payer: Self-pay | Admitting: Family Medicine

## 2018-08-10 ENCOUNTER — Encounter (HOSPITAL_COMMUNITY): Payer: Self-pay | Admitting: *Deleted

## 2018-08-10 ENCOUNTER — Inpatient Hospital Stay (HOSPITAL_COMMUNITY): Payer: Self-pay

## 2018-08-10 ENCOUNTER — Inpatient Hospital Stay (HOSPITAL_COMMUNITY)
Admission: AD | Admit: 2018-08-10 | Discharge: 2018-08-10 | Disposition: A | Discharge status: Home / Self Care | Payer: Self-pay | Attending: Obstetrics and Gynecology | Admitting: Obstetrics and Gynecology

## 2018-08-10 ENCOUNTER — Other Ambulatory Visit: Payer: Self-pay

## 2018-08-10 DIAGNOSIS — O26891 Other specified pregnancy related conditions, first trimester: Secondary | ICD-10-CM | POA: Insufficient documentation

## 2018-08-10 DIAGNOSIS — Z6790 Unspecified blood type, Rh positive: Secondary | ICD-10-CM | POA: Insufficient documentation

## 2018-08-10 DIAGNOSIS — O4690 Antepartum hemorrhage, unspecified, unspecified trimester: Secondary | ICD-10-CM

## 2018-08-10 DIAGNOSIS — Z3A01 Less than 8 weeks gestation of pregnancy: Secondary | ICD-10-CM | POA: Insufficient documentation

## 2018-08-10 DIAGNOSIS — O4691 Antepartum hemorrhage, unspecified, first trimester: Secondary | ICD-10-CM

## 2018-08-10 DIAGNOSIS — R102 Pelvic and perineal pain: Secondary | ICD-10-CM | POA: Insufficient documentation

## 2018-08-10 DIAGNOSIS — Z87891 Personal history of nicotine dependence: Secondary | ICD-10-CM | POA: Insufficient documentation

## 2018-08-10 DIAGNOSIS — O3680X0 Pregnancy with inconclusive fetal viability, not applicable or unspecified: Secondary | ICD-10-CM | POA: Insufficient documentation

## 2018-08-10 DIAGNOSIS — O209 Hemorrhage in early pregnancy, unspecified: Secondary | ICD-10-CM | POA: Insufficient documentation

## 2018-08-10 DIAGNOSIS — O26899 Other specified pregnancy related conditions, unspecified trimester: Secondary | ICD-10-CM

## 2018-08-10 DIAGNOSIS — O360110 Maternal care for anti-D [Rh] antibodies, first trimester, not applicable or unspecified: Secondary | ICD-10-CM

## 2018-08-10 DIAGNOSIS — Z7982 Long term (current) use of aspirin: Secondary | ICD-10-CM | POA: Insufficient documentation

## 2018-08-10 LAB — WET PREP, GENITAL
Sperm: NONE SEEN
Trich, Wet Prep: NONE SEEN
Yeast Wet Prep HPF POC: NONE SEEN

## 2018-08-10 LAB — URINALYSIS, ROUTINE W REFLEX MICROSCOPIC
Bacteria, UA: NONE SEEN
Bilirubin Urine: NEGATIVE
Glucose, UA: NEGATIVE mg/dL
Ketones, ur: NEGATIVE mg/dL
Nitrite: NEGATIVE
Protein, ur: NEGATIVE mg/dL
Specific Gravity, Urine: 1.003 — ABNORMAL LOW (ref 1.005–1.030)
pH: 7 (ref 5.0–8.0)

## 2018-08-10 LAB — CBC
HCT: 38.1 % (ref 36.0–46.0)
Hemoglobin: 12.2 g/dL (ref 12.0–15.0)
MCH: 28.2 pg (ref 26.0–34.0)
MCHC: 32 g/dL (ref 30.0–36.0)
MCV: 88 fL (ref 80.0–100.0)
Platelets: 324 10*3/uL (ref 150–400)
RBC: 4.33 MIL/uL (ref 3.87–5.11)
RDW: 12.6 % (ref 11.5–15.5)
WBC: 6.8 10*3/uL (ref 4.0–10.5)
nRBC: 0 % (ref 0.0–0.2)

## 2018-08-10 LAB — HCG, QUANTITATIVE, PREGNANCY: hCG, Beta Chain, Quant, S: 435 m[IU]/mL — ABNORMAL HIGH (ref <5)

## 2018-08-10 LAB — POCT PREGNANCY, URINE: Preg Test, Ur: POSITIVE — AB

## 2018-08-10 NOTE — Discharge Instructions (Signed)
Ectopic Pregnancy ° °An ectopic pregnancy is when the fertilized egg attaches (implants) outside the uterus. Most ectopic pregnancies occur in one of the tubes where eggs travel from the ovary to the uterus (fallopian tubes), but the implanting can occur in other locations. In rare cases, ectopic pregnancies occur on the ovary, intestine, pelvis, abdomen, or cervix. In an ectopic pregnancy, the fertilized egg does not have the ability to develop into a normal, healthy baby. °A ruptured ectopic pregnancy is one in which tearing or bursting of a fallopian tube causes internal bleeding. Often, there is intense lower abdominal pain, and vaginal bleeding sometimes occurs. Having an ectopic pregnancy can be life-threatening. If this dangerous condition is not treated, it can lead to blood loss, shock, or even death. °What are the causes? °The most common cause of this condition is damage to one of the fallopian tubes. A fallopian tube may be narrowed or blocked, and that keeps the fertilized egg from reaching the uterus. °What increases the risk? °This condition is more likely to develop in women of childbearing age who have different levels of risk. The levels of risk can be divided into three categories. °High risk °· You have gone through infertility treatment. °· You have had an ectopic pregnancy before. °· You have had surgery on the fallopian tubes, or another surgical procedure, such as an abortion. °· You have had surgery to have the fallopian tubes tied (tubal ligation). °· You have problems or diseases of the fallopian tubes. °· You have been exposed to diethylstilbestrol (DES). This medicine was used until 1971, and it had effects on babies whose mothers took the medicine. °· You become pregnant while using an IUD (intrauterine device) for birth control. °Moderate risk °· You have a history of infertility. °· You have had an STI (sexually transmitted infection). °· You have a history of pelvic inflammatory  disease (PID). °· You have scarring from endometriosis. °· You have multiple sexual partners. °· You smoke. °Low risk °· You have had pelvic surgery. °· You use vaginal douches. °· You became sexually active before age 18. °What are the signs or symptoms? °Common symptoms of this condition include normal pregnancy symptoms, such as missing a period, nausea, tiredness, abdominal pain, breast tenderness, and bleeding. However, ectopic pregnancy will have additional symptoms, such as: °· Pain with intercourse. °· Irregular vaginal bleeding or spotting. °· Cramping or pain on one side or in the lower abdomen. °· Fast heartbeat, low blood pressure, and sweating. °· Passing out while having a bowel movement. °Symptoms of a ruptured ectopic pregnancy and internal bleeding may include: °· Sudden, severe pain in the abdomen and pelvis. °· Dizziness, weakness, light-headedness, or fainting. °· Pain in the shoulder or neck area. °How is this diagnosed? °This condition is diagnosed by: °· A pelvic exam to locate pain or a mass in the abdomen. °· A pregnancy test. This blood test checks for the presence as well as the specific level of pregnancy hormone in the bloodstream. °· Ultrasound. This is performed if a pregnancy test is positive. In this test, a probe is inserted into the vagina. The probe will detect a fetus, possibly in a location other than the uterus. °· Taking a sample of uterus tissue (dilation and curettage, or D&C). °· Surgery to perform a visual exam of the inside of the abdomen using a thin, lighted tube that has a tiny camera on the end (laparoscope). °· Culdocentesis. This procedure involves inserting a needle at the top of   the vagina, behind the uterus. If blood is present in this area, it may indicate that a fallopian tube is torn. How is this treated? This condition is treated with medicine or surgery. Medicine  An injection of a medicine (methotrexate) may be given to cause the pregnancy tissue to be  absorbed. This medicine may save your fallopian tube. It may be given if: ? The diagnosis is made early, with no signs of active bleeding. ? The fallopian tube has not ruptured. ? You are considered to be a good candidate for the medicine. Usually, pregnancy hormone blood levels are checked after methotrexate treatment. This is to be sure that the medicine is effective. It may take 4-6 weeks for the pregnancy to be absorbed. Most pregnancies will be absorbed by 3 weeks. Surgery  A laparoscope may be used to remove the pregnancy tissue.  If severe internal bleeding occurs, a larger cut (incision) may be made in the lower abdomen (laparotomy) to remove the fetus and placenta. This is done to stop the bleeding.  Part or all of the fallopian tube may be removed (salpingectomy) along with the fetus and placenta. The fallopian tube may also be repaired during the surgery.  In very rare circumstances, removal of the uterus (hysterectomy) may be required.  After surgery, pregnancy hormone testing may be done to be sure that there is no pregnancy tissue left. Whether your treatment is medicine or surgery, you may receive a Rho (D) immune globulin shot to prevent problems with any future pregnancy. This shot may be given if:  You are Rh-negative and the baby's father is Rh-positive.  You are Rh-negative and you do not know the Rh type of the baby's father. Follow these instructions at home:  Rest and limit your activity after the procedure for as long as told by your health care provider.  Until your health care provider says that it is safe: ? Do not lift anything that is heavier than 10 lb (4.5 kg), or the limit that your health care provider tells you. ? Avoid physical exercise and any movement that requires effort (is strenuous).  To help prevent constipation: ? Eat a healthy diet that includes fruits, vegetables, and whole grains. ? Drink 6-8 glasses of water per day. Get help right away  if:  You develop worsening pain that is not relieved by medicine.  You have: ? A fever or chills. ? Vaginal bleeding. ? Redness and swelling at the incision site. ? Nausea and vomiting.  You feel dizzy or weak.  You feel light-headed or you faint. This information is not intended to replace advice given to you by your health care provider. Make sure you discuss any questions you have with your health care provider. Document Released: 05/21/2004 Document Revised: 12/11/2015 Document Reviewed: 11/13/2015 Elsevier Interactive Patient Education  2019 [place]. Vaginal Bleeding During Pregnancy, First Trimester  A small amount of bleeding from the vagina (spotting) is relatively common during early pregnancy. It usually stops on its own. Various things may cause bleeding or spotting during early pregnancy. Some bleeding may be related to the pregnancy, and some may not. In many cases, the bleeding is normal and is not a problem. However, bleeding can also be a sign of something serious. Be sure to tell your health care provider about any vaginal bleeding right away. Some possible causes of vaginal bleeding during the first trimester include:  Infection or inflammation of the cervix.  Growths (polyps) on the cervix.  Miscarriage or  threatened miscarriage.  Pregnancy tissue developing outside of the uterus (ectopic pregnancy).  A mass of tissue developing in the uterus due to an egg being fertilized incorrectly (molar pregnancy). Follow these instructions at home: Activity  Follow instructions from your health care provider about limiting your activity. Ask what activities are safe for you.  If needed, make plans for someone to help with your regular activities.  Do not have sex or orgasms until your health care provider says that this is safe. General instructions  Take over-the-counter and prescription medicines only as told by your health care provider.  Pay attention to  any changes in your symptoms.  Do not use tampons or douche.  Write down how many pads you use each day, how often you change pads, and how soaked (saturated) they are.  If you pass any tissue from your vagina, save the tissue so you can show it to your health care provider.  Keep all follow-up visits as told by your health care provider. This is important. Contact a health care provider if:  You have vaginal bleeding during any part of your pregnancy.  You have cramps or labor pains.  You have a fever. Get help right away if:  You have severe cramps in your back or abdomen.  You pass large clots or a large amount of tissue from your vagina.  Your bleeding increases.  You feel light-headed or weak, or you faint.  You have chills.  You are leaking fluid or have a gush of fluid from your vagina. Summary  A small amount of bleeding (spotting) from the vagina is relatively common during early pregnancy.  Various things may cause bleeding or spotting in early pregnancy.  Be sure to tell your health care provider about any vaginal bleeding right away. This information is not intended to replace advice given to you by your health care provider. Make sure you discuss any questions you have with your health care provider. Document Released: 01/21/2005 Document Revised: 07/16/2016 Document Reviewed: 07/16/2016 Elsevier Interactive Patient Education  2019 [employer].

## 2018-08-10 NOTE — MAU Note (Signed)
Pt presents to MAU with complaints of vaginal bleeding that started this morning. Positive pregnancy test April 1st. Lower abdominal cramping

## 2018-08-10 NOTE — Telephone Encounter (Signed)
Patient called in stating that she needed to make an appointment because she had 2 positive preg test at home but she has been having some light bleeding. Patient also stated that she had surgery 2 years ago. Patient was placed on hold and consulted with [facility] about patient. Jeanett Schlein advised that patient to go to MAU to be evaluated. Patient verbalized understanding.

## 2018-08-10 NOTE — MAU Provider Note (Addendum)
History     CSN: [id number]  Arrival date and time: 08/10/18 1131   First Provider Initiated Contact with Patient 08/10/18 1247      Chief Complaint  Patient presents with  . Possible Pregnancy  . Vaginal Bleeding   Victoria Trujillo is a 33 y.o. [phone] at [redacted]w[redacted]d who presents to MAU for vaginal bleeding which began this morning. Pt reports spotting around 0530 today. Pt denies any vaginal bleeding currently in MAU. Pt also reports pelvic pressure and cramping on the right side.  Passing blood clots? no Blood soaking clothes? no Lightheaded/dizzy? no Significant pelvic pain or cramping? no Passed any tissue? no Hx ectopic pregnancy? no Hx of PID, GYN surgery? Dermoid cyst removal, right ovary Blood Type? A Positive Allergies? NKDA Current medications? PNVs, "Goody Powder" for HA  Pt denies vaginal discharge/odor/itching. Pt denies N/V, abdominal pain, constipation, diarrhea, or urinary problems. Pt denies fever, chills, fatigue, sweating or changes in appetite. Pt denies SOB or chest pain. Pt denies dizziness, HA, light-headedness, weakness.  OB History    Gravida  4   Para  2   Term  2   Preterm  0   AB  1   Living  2     SAB  0   TAB  1   Ectopic  0   Multiple  0   Live Births  2           Past Medical History:  Diagnosis Date  . Headache(784.0)   . Infection    UTI  . Ovarian cyst   . Pregnancy induced hypertension     Past Surgical History:  Procedure Laterality Date  . LAPAROSCOPIC OVARIAN CYSTECTOMY Right 02/02/2017   Procedure: LAPAROSCOPIC OVARIAN CYSTECTOMY;  Surgeon: Donnamae Jude, MD;  Location: [facility];  Service: Gynecology;  Laterality: Right;  . THERAPEUTIC ABORTION    . WISDOM TOOTH EXTRACTION      Family History  Problem Relation Age of Onset  . Hypertension Mother   . Diabetes Father   . Hypertension Father   . Heart disease Father   . Stroke Father   . Hypertension Maternal Grandmother   . Cancer Maternal  Grandmother   . Kidney disease Maternal Grandfather     Social History   Tobacco Use  . Smoking status: Former Research scientist (life sciences)  . Smokeless tobacco: Never Used  . Tobacco comment: not daily  Substance Use Topics  . Alcohol use: Yes  . Drug use: No    Allergies: No Known Allergies  Medications Prior to Admission  Medication Sig Dispense Refill Last Dose  . Multiple Vitamin (MULTIVITAMIN WITH MINERALS) TABS tablet Take 1 tablet by mouth daily. Women's One-A-Day Multivitamin.   08/10/2018 at Unknown time  . aspirin-acetaminophen-caffeine (EXCEDRIN MIGRAINE) 250-250-65 MG tablet Take 2 tablets by mouth every 8 (eight) hours as needed for headache.   Unknown at Unknown time  . ibuprofen (ADVIL,MOTRIN) 800 MG tablet Take 1 tablet (800 mg total) by mouth every 8 (eight) hours as needed. 30 tablet 0   . metroNIDAZOLE (FLAGYL) 500 MG tablet Take 1 tablet (500 mg total) by mouth 2 (two) times daily. (Patient not taking: Reported on 01/25/2017) 14 tablet 0 Not Taking at Unknown time  . oxyCODONE-acetaminophen (PERCOCET/ROXICET) 5-325 MG tablet Take 1-2 tablets by mouth every 6 (six) hours as needed. 15 tablet 0     Review of Systems  Constitutional: Negative for appetite change, chills, diaphoresis, fatigue and fever.  Respiratory: Negative for shortness of  breath.   Cardiovascular: Negative for chest pain.  Gastrointestinal: Negative for abdominal pain, constipation, diarrhea, nausea and vomiting.  Genitourinary: Positive for pelvic pain and vaginal bleeding. Negative for dysuria, flank pain, frequency, urgency and vaginal discharge.  Neurological: Negative for dizziness, weakness, light-headedness and headaches.   Physical Exam   Blood pressure 133/74, pulse 97, temperature 98.5 F (36.9 C), resp. rate 16, height 5\' 5"  (1.651 m), weight 107 kg, last menstrual period 06/24/2018.  Patient Vitals for the past 24 hrs:  BP Temp Pulse Resp Height Weight  08/10/18 1155 133/74 - 97 16 - -  08/10/18  1152 - 98.5 F (36.9 C) - - 5\' 5"  (1.651 m) 107 kg    Physical Exam  Constitutional: She is oriented to person, place, and time. She appears well-developed and well-nourished. No distress.  HENT:  Head: Normocephalic and atraumatic.  Respiratory: Effort normal.  GI: Soft. She exhibits no distension and no mass. There is no abdominal tenderness. There is no rebound and no guarding.  Genitourinary: There is no rash, tenderness or lesion on the right labia. There is no rash, tenderness or lesion on the left labia. Uterus is not tender. Cervix exhibits friability. Cervix exhibits no motion tenderness and no discharge. Right adnexum displays no mass, no tenderness and no fullness. Left adnexum displays no mass, no tenderness and no fullness.    No vaginal discharge or bleeding.  No bleeding in the vagina.  Neurological: She is alert and oriented to person, place, and time.  Skin: Skin is warm and dry. She is not diaphoretic.  Psychiatric: She has a normal mood and affect. Her behavior is normal.   Results for orders placed or performed during the hospital encounter of 08/10/18 (from the past 24 hour(s))  Pregnancy, urine POC     Status: Abnormal   Collection Time: 08/10/18 11:58 AM  Result Value Ref Range   Preg Test, Ur POSITIVE (A) NEGATIVE  Urinalysis, Routine w reflex microscopic     Status: Abnormal   Collection Time: 08/10/18 12:01 PM  Result Value Ref Range   Color, Urine STRAW (A) YELLOW   APPearance CLEAR CLEAR   Specific Gravity, Urine 1.003 (L) 1.005 - 1.030   pH 7.0 5.0 - 8.0   Glucose, UA NEGATIVE NEGATIVE mg/dL   Hgb urine dipstick MODERATE (A) NEGATIVE   Bilirubin Urine NEGATIVE NEGATIVE   Ketones, ur NEGATIVE NEGATIVE mg/dL   Protein, ur NEGATIVE NEGATIVE mg/dL   Nitrite NEGATIVE NEGATIVE   Leukocytes,Ua LARGE (A) NEGATIVE   RBC / HPF 0-5 0 - 5 RBC/hpf   WBC, UA 6-10 0 - 5 WBC/hpf   Bacteria, UA NONE SEEN NONE SEEN   Squamous Epithelial / LPF 0-5 0 - 5  Wet prep,  genital     Status: Abnormal   Collection Time: 08/10/18  1:00 PM  Result Value Ref Range   Yeast Wet Prep HPF POC NONE SEEN NONE SEEN   Trich, Wet Prep NONE SEEN NONE SEEN   Clue Cells Wet Prep HPF POC PRESENT (A) NONE SEEN   WBC, Wet Prep HPF POC MANY (A) NONE SEEN   Sperm NONE SEEN   CBC     Status: None   Collection Time: 08/10/18  1:09 PM  Result Value Ref Range   WBC 6.8 4.0 - 10.5 K/uL   RBC 4.33 3.87 - 5.11 MIL/uL   Hemoglobin 12.2 12.0 - 15.0 g/dL   HCT 38.1 36.0 - 46.0 %   MCV 88.0 80.0 -  100.0 fL   MCH 28.2 26.0 - 34.0 pg   MCHC 32.0 30.0 - 36.0 g/dL   RDW 12.6 11.5 - 15.5 %   Platelets 324 150 - 400 K/uL   nRBC 0.0 0.0 - 0.2 %  hCG, quantitative, pregnancy     Status: Abnormal   Collection Time: 08/10/18  1:09 PM  Result Value Ref Range   hCG, Beta Chain, Quant, S 435 (H) <5 mIU/mL    US Ob Less Than 14 Weeks With Ob Transvaginal  Result Date: 08/10/2018 CLINICAL DATA:  Vaginal bleeding EXAM: OBSTETRIC <14 WK [place] AND TRANSVAGINAL OB US TECHNIQUE: Both transabdominal and transvaginal ultrasound examinations were performed for complete evaluation of the gestation as well as the maternal uterus, adnexal regions, and pelvic cul-de-sac. Transvaginal technique was performed to assess early pregnancy. COMPARISON:  None. FINDINGS: Intrauterine gestational sac: None Yolk sac:  Not Visualized. Embryo:  Not Visualized. Cardiac Activity: Not Visualized. Subchorionic hemorrhage:  None visualized. Maternal uterus/adnexae: Normal bilateral ovaries. No adnexal mass. No pelvic free fluid. IMPRESSION: 1. No intrauterine pregnancy. In the setting an elevated beta HCG, differential diagnosis includes missed abortion, pregnancy too early to detect versus ectopic pregnancy. Recommend clinical correlation, serial quantitative beta HCGs, ectopic precautions, and followup ultrasound as clinically indicated. Electronically Signed   By: Kathreen Devoid   On: 08/10/2018 13:44    MAU Course   Procedures  MDM -r/o ectopic -UA: straw/1.003/mod hgb/lg leuks, sending urine for culture -WetPrep: +ClueCells (isolated finding not needing treatment), +WBCs -GC/CT collected -CBC: WNL -hCG: 435 -[place]: no IUP -pt to have repeat hCG on Friday afternoon at 1pm in MAU, no appts available at [facility] on Friday for lab -pt discharged to home in stable condition  Orders Placed This Encounter  Procedures  . Wet prep, genital    Standing Status:   Standing    Number of Occurrences:   1  . Culture, OB Urine    Standing Status:   Standing    Number of Occurrences:   1  . US OB LESS THAN 14 WEEKS WITH OB TRANSVAGINAL    Standing Status:   Standing    Number of Occurrences:   1    Order Specific Question:   Symptom/Reason for Exam    Answer:   Vaginal bleeding in pregnancy [[id number]]    Order Specific Question:   Symptom/Reason for Exam    Answer:   Pelvic pain in pregnancy [[id number]]  . Urinalysis, Routine w reflex microscopic    Standing Status:   Standing    Number of Occurrences:   1  . CBC    Standing Status:   Standing    Number of Occurrences:   1  . hCG, quantitative, pregnancy    Standing Status:   Standing    Number of Occurrences:   1  . Pregnancy, urine POC    Standing Status:   Standing    Number of Occurrences:   1  . Discharge patient    Order Specific Question:   Discharge disposition    Answer:   01-Home or Self Care [1]    Order Specific Question:   Discharge patient date    Answer:   08/10/2018   No orders of the defined types were placed in this encounter.  Assessment and Plan   1. Pregnancy of unknown anatomic location   2. Pelvic pain in pregnancy   3. Vaginal bleeding in pregnancy   4. Blood type, Rh positive    Allergies  as of 08/10/2018   No Known Allergies     Medication List    STOP taking these medications   Excedrin Migraine 250-250-65 MG tablet Generic drug:  aspirin-acetaminophen-caffeine   ibuprofen 800 MG tablet Commonly known as:   ADVIL,MOTRIN     TAKE these medications   metroNIDAZOLE 500 MG tablet Commonly known as:  FLAGYL Take 1 tablet (500 mg total) by mouth 2 (two) times daily.   multivitamin with minerals Tabs tablet Take 1 tablet by mouth daily. Women's One-A-Day Multivitamin.   oxyCODONE-acetaminophen 5-325 MG tablet Commonly known as:  PERCOCET/ROXICET Take 1-2 tablets by mouth every 6 (six) hours as needed.      -will call with culture results, if positive -strict ectopic/bleeding/return MAU precautions given -pt to return to MAU on Friday @1300  for repeat hCG -pt discharged to home in stable condition  Elmyra Ricks E  08/10/2018, 2:45 PM

## 2018-08-11 LAB — GC/CHLAMYDIA PROBE AMP (~~LOC~~) NOT AT ARMC
Chlamydia: NEGATIVE
Neisseria Gonorrhea: NEGATIVE

## 2018-08-11 LAB — CULTURE, OB URINE

## 2018-08-12 ENCOUNTER — Inpatient Hospital Stay (HOSPITAL_COMMUNITY)
Admission: AD | Admit: 2018-08-12 | Discharge: 2018-08-12 | Disposition: A | Discharge status: Home / Self Care | Payer: Medicaid Other | Attending: Obstetrics and Gynecology | Admitting: Obstetrics and Gynecology

## 2018-08-12 ENCOUNTER — Other Ambulatory Visit: Payer: Self-pay

## 2018-08-12 DIAGNOSIS — Z3A01 Less than 8 weeks gestation of pregnancy: Secondary | ICD-10-CM | POA: Insufficient documentation

## 2018-08-12 DIAGNOSIS — O3680X0 Pregnancy with inconclusive fetal viability, not applicable or unspecified: Secondary | ICD-10-CM

## 2018-08-12 DIAGNOSIS — O0281 Inappropriate change in quantitative human chorionic gonadotropin (hCG) in early pregnancy: Secondary | ICD-10-CM

## 2018-08-12 LAB — HCG, QUANTITATIVE, PREGNANCY: hCG, Beta Chain, Quant, S: 560 m[IU]/mL — ABNORMAL HIGH (ref <5)

## 2018-08-12 NOTE — MAU Provider Note (Signed)
S: 33 y.o. [id number] @[redacted]w[redacted]d  by LMP presents to MAU for repeat hcg.  She denies abdominal pain or vaginal bleeding today.    Her quant hcg on 4/15 was 435 and ultrasound showed no IUP.  HPI  O: BP 136/75 (BP Location: Right Arm)   Pulse 96   Temp 99 F (37.2 C) (Oral)   Resp 17   Ht 5\' 5"  (1.651 m)   Wt 105.9 kg   LMP 06/24/2018   SpO2 100%   BMI 38.84 kg/m   VS reviewed, nursing note reviewed,  Constitutional: well developed, well nourished, no distress HEENT: normocephalic CV: normal rate Pulm/chest wall: normal effort Abdomen: soft Neuro: alert and oriented x 3 Skin: warm, dry Psych: affect normal  Results for orders placed or performed during the hospital encounter of 08/12/18 (from the past 24 hour(s))  hCG, quantitative, pregnancy     Status: Abnormal   Collection Time: 08/12/18  1:46 PM  Result Value Ref Range   hCG, Beta Chain, Quant, S 560 (H) <5 mIU/mL       MDM: Ordered labs/reviewed results.  Quant hcg rose inappropriately.  Discussed results with pt.  Pt denies any pain or bleeding today.  Pt to return to MAU in 48 hours. Prefers Sunday MAU to Monday in the office.  Ectopic precautions given and pt to return to MAU sooner if s/sx of ectopic, as ruptured ectopic can be life threatening.  Pt stable at time of discharge.  A: 1. Inappropriate change in quantitative hCG in early pregnancy   2. Pregnancy of unknown anatomic location     P: D/C home with ectopic/bleeding precautions Return to MAU in 48 hours for repeat hcg Return to MAU sooner as needed for emergencies  LEFTWICH-KIRBY, LISA, CNM 2:18 PM

## 2018-08-12 NOTE — MAU Note (Signed)
Doing good. No pain or bleeding.

## 2018-08-14 ENCOUNTER — Telehealth: Payer: Self-pay | Admitting: Women's Health

## 2018-08-14 ENCOUNTER — Inpatient Hospital Stay (HOSPITAL_COMMUNITY)
Admission: AD | Admit: 2018-08-14 | Discharge: 2018-08-14 | Disposition: A | Discharge status: Home / Self Care | Payer: Medicaid Other | Source: Ambulatory Visit | Attending: Obstetrics & Gynecology | Admitting: Obstetrics & Gynecology

## 2018-08-14 ENCOUNTER — Other Ambulatory Visit: Payer: Self-pay

## 2018-08-14 DIAGNOSIS — O0281 Inappropriate change in quantitative human chorionic gonadotropin (hCG) in early pregnancy: Secondary | ICD-10-CM | POA: Insufficient documentation

## 2018-08-14 DIAGNOSIS — O3680X0 Pregnancy with inconclusive fetal viability, not applicable or unspecified: Secondary | ICD-10-CM | POA: Insufficient documentation

## 2018-08-14 DIAGNOSIS — O009 Unspecified ectopic pregnancy without intrauterine pregnancy: Secondary | ICD-10-CM | POA: Insufficient documentation

## 2018-08-14 DIAGNOSIS — O26891 Other specified pregnancy related conditions, first trimester: Secondary | ICD-10-CM | POA: Insufficient documentation

## 2018-08-14 DIAGNOSIS — Z6790 Unspecified blood type, Rh positive: Secondary | ICD-10-CM | POA: Insufficient documentation

## 2018-08-14 DIAGNOSIS — R109 Unspecified abdominal pain: Secondary | ICD-10-CM | POA: Insufficient documentation

## 2018-08-14 DIAGNOSIS — Z3A01 Less than 8 weeks gestation of pregnancy: Secondary | ICD-10-CM | POA: Insufficient documentation

## 2018-08-14 LAB — CBC WITH DIFFERENTIAL/PLATELET
Abs Immature Granulocytes: 0.02 10*3/uL (ref 0.00–0.07)
Basophils Absolute: 0 10*3/uL (ref 0.0–0.1)
Basophils Relative: 0 %
Eosinophils Absolute: 0.2 10*3/uL (ref 0.0–0.5)
Eosinophils Relative: 2 %
HCT: 40.4 % (ref 36.0–46.0)
Hemoglobin: 12.8 g/dL (ref 12.0–15.0)
Immature Granulocytes: 0 %
Lymphocytes Relative: 28 %
Lymphs Abs: 2.1 10*3/uL (ref 0.7–4.0)
MCH: 27.7 pg (ref 26.0–34.0)
MCHC: 31.7 g/dL (ref 30.0–36.0)
MCV: 87.4 fL (ref 80.0–100.0)
Monocytes Absolute: 0.5 10*3/uL (ref 0.1–1.0)
Monocytes Relative: 6 %
Neutro Abs: 4.8 10*3/uL (ref 1.7–7.7)
Neutrophils Relative %: 64 %
Platelets: 387 10*3/uL (ref 150–400)
RBC: 4.62 MIL/uL (ref 3.87–5.11)
RDW: 12.7 % (ref 11.5–15.5)
WBC: 7.6 10*3/uL (ref 4.0–10.5)
nRBC: 0 % (ref 0.0–0.2)

## 2018-08-14 LAB — COMPREHENSIVE METABOLIC PANEL
ALT: 20 U/L (ref 0–44)
AST: 21 U/L (ref 15–41)
Albumin: 4.3 g/dL (ref 3.5–5.0)
Alkaline Phosphatase: 48 U/L (ref 38–126)
Anion gap: 9 (ref 5–15)
BUN: 10 mg/dL (ref 6–20)
CO2: 22 mmol/L (ref 22–32)
Calcium: 9.7 mg/dL (ref 8.9–10.3)
Chloride: 106 mmol/L (ref 98–111)
Creatinine, Ser: 0.88 mg/dL (ref 0.44–1.00)
GFR calc Af Amer: >60 mL/min (ref >60)
GFR calc non Af Amer: >60 mL/min (ref >60)
Glucose, Bld: 97 mg/dL (ref 70–99)
Potassium: 4 mmol/L (ref 3.5–5.1)
Sodium: 137 mmol/L (ref 135–145)
Total Bilirubin: 0.7 mg/dL (ref 0.3–1.2)
Total Protein: 7.4 g/dL (ref 6.5–8.1)

## 2018-08-14 LAB — HCG, QUANTITATIVE, PREGNANCY: hCG, Beta Chain, Quant, S: 677 m[IU]/mL — ABNORMAL HIGH (ref <5)

## 2018-08-14 MED ORDER — METHOTREXATE FOR ECTOPIC PREGNANCY
50.0000 mg/m2 | Freq: Once | INTRAMUSCULAR | Status: AC
  Administered 2018-08-14: 110 mg via INTRAMUSCULAR
  Filled 2018-08-14: qty 1

## 2018-08-14 NOTE — Discharge Instructions (Signed)
Methotrexate Treatment for an Ectopic Pregnancy, Care After This sheet gives you information about how to care for yourself after your procedure. Your health care provider may also give you more specific instructions. If you have problems or questions, contact your health care provider. What can I expect after the procedure? After the procedure, it is common to have:  Abdominal cramping.  Vaginal bleeding.  Fatigue.  Nausea.  Vomiting.  Diarrhea. Blood tests will be taken at timed intervals for several days or weeks to check your pregnancy hormone levels. The blood tests will be done until the pregnancy hormone can no longer be detected in the blood. Follow these instructions at home: Activity  Do not have sex until your health care provider approves.  Limit activities that take a lot of effort as told by your health care provider. Medicines  Take over the counter and prescription medicines only as told by your health care provider.  Do not take aspirin, ibuprofen, naproxen, or any other NSAIDs.  Do not take folic acid, prenatal vitamins, or other vitamins that contain folic acid. General instructions   Do not drink alcohol.  Follow instructions from your health care provider on how and when to report any symptoms that may indicate a ruptured ectopic pregnancy.  Keep all follow-up visits as told by your health care provider. This is important. Contact a health care provider if:  You have persistent nausea and vomiting.  You have persistent diarrhea.  You are having a reaction to the medicine, such as: ? Tiredness. ? Skin rash. ? Hair loss. Get help right away if:  Your abdominal or pelvic pain gets worse.  You have more vaginal bleeding.  You feel light-headed or you faint.  You have shortness of breath.  Your heart rate increases.  You develop a cough.  You have chills.  You have a fever. Summary  After the procedure, it is common to have symptoms  of abdominal cramping, vaginal bleeding and fatigue. You may also experience other symptoms.  Blood tests will be taken at timed intervals for several days or weeks to check your pregnancy hormone levels. The blood tests will be done until the pregnancy hormone can no longer be detected in the blood.  Limit strenuous activity as told by your health care provider.  Follow instructions from your health care provider on how and when to report any symptoms that may indicate a ruptured ectopic pregnancy. This information is not intended to replace advice given to you by your health care provider. Make sure you discuss any questions you have with your health care provider. Document Released: 04/02/2011 Document Revised: 06/02/2016 Document Reviewed: 06/02/2016 Elsevier Interactive Patient Education  2019 [employer].   Ectopic Pregnancy  An ectopic pregnancy is when the fertilized egg attaches (implants) outside the uterus. Most ectopic pregnancies occur in one of the tubes where eggs travel from the ovary to the uterus (fallopian tubes), but the implanting can occur in other locations. In rare cases, ectopic pregnancies occur on the ovary, intestine, pelvis, abdomen, or cervix. In an ectopic pregnancy, the fertilized egg does not have the ability to develop into a normal, healthy baby. A ruptured ectopic pregnancy is one in which tearing or bursting of a fallopian tube causes internal bleeding. Often, there is intense lower abdominal pain, and vaginal bleeding sometimes occurs. Having an ectopic pregnancy can be life-threatening. If this dangerous condition is not treated, it can lead to blood loss, shock, or even death. What are the causes?  The most common cause of this condition is damage to one of the fallopian tubes. A fallopian tube may be narrowed or blocked, and that keeps the fertilized egg from reaching the uterus. What increases the risk? This condition is more likely to develop in women of  childbearing age who have different levels of risk. The levels of risk can be divided into three categories. High risk  You have gone through infertility treatment.  You have had an ectopic pregnancy before.  You have had surgery on the fallopian tubes, or another surgical procedure, such as an abortion.  You have had surgery to have the fallopian tubes tied (tubal ligation).  You have problems or diseases of the fallopian tubes.  You have been exposed to diethylstilbestrol (DES). This medicine was used until 1971, and it had effects on babies whose mothers took the medicine.  You become pregnant while using an IUD (intrauterine device) for birth control. Moderate risk  You have a history of infertility.  You have had an STI (sexually transmitted infection).  You have a history of pelvic inflammatory disease (PID).  You have scarring from endometriosis.  You have multiple sexual partners.  You smoke. Low risk  You have had pelvic surgery.  You use vaginal douches.  You became sexually active before age 49. What are the signs or symptoms? Common symptoms of this condition include normal pregnancy symptoms, such as missing a period, nausea, tiredness, abdominal pain, breast tenderness, and bleeding. However, ectopic pregnancy will have additional symptoms, such as:  Pain with intercourse.  Irregular vaginal bleeding or spotting.  Cramping or pain on one side or in the lower abdomen.  Fast heartbeat, low blood pressure, and sweating.  Passing out while having a bowel movement. Symptoms of a ruptured ectopic pregnancy and internal bleeding may include:  Sudden, severe pain in the abdomen and pelvis.  Dizziness, weakness, light-headedness, or fainting.  Pain in the shoulder or neck area. How is this diagnosed? This condition is diagnosed by:  A pelvic exam to locate pain or a mass in the abdomen.  A pregnancy test. This blood test checks for the presence as well  as the specific level of pregnancy hormone in the bloodstream.  Ultrasound. This is performed if a pregnancy test is positive. In this test, a probe is inserted into the vagina. The probe will detect a fetus, possibly in a location other than the uterus.  Taking a sample of uterus tissue (dilation and curettage, or D&C).  Surgery to perform a visual exam of the inside of the abdomen using a thin, lighted tube that has a tiny camera on the end (laparoscope).  Culdocentesis. This procedure involves inserting a needle at the top of the vagina, behind the uterus. If blood is present in this area, it may indicate that a fallopian tube is torn. How is this treated? This condition is treated with medicine or surgery. Medicine  An injection of a medicine (methotrexate) may be given to cause the pregnancy tissue to be absorbed. This medicine may save your fallopian tube. It may be given if: ? The diagnosis is made early, with no signs of active bleeding. ? The fallopian tube has not ruptured. ? You are considered to be a good candidate for the medicine. Usually, pregnancy hormone blood levels are checked after methotrexate treatment. This is to be sure that the medicine is effective. It may take 4-6 weeks for the pregnancy to be absorbed. Most pregnancies will be absorbed by 3 weeks.  Surgery  A laparoscope may be used to remove the pregnancy tissue.  If severe internal bleeding occurs, a larger cut (incision) may be made in the lower abdomen (laparotomy) to remove the fetus and placenta. This is done to stop the bleeding.  Part or all of the fallopian tube may be removed (salpingectomy) along with the fetus and placenta. The fallopian tube may also be repaired during the surgery.  In very rare circumstances, removal of the uterus (hysterectomy) may be required.  After surgery, pregnancy hormone testing may be done to be sure that there is no pregnancy tissue left. Whether your treatment is  medicine or surgery, you may receive a Rho (D) immune globulin shot to prevent problems with any future pregnancy. This shot may be given if:  You are Rh-negative and the baby's father is Rh-positive.  You are Rh-negative and you do not know the Rh type of the baby's father. Follow these instructions at home:  Rest and limit your activity after the procedure for as long as told by your health care provider.  Until your health care provider says that it is safe: ? Do not lift anything that is heavier than 10 lb (4.5 kg), or the limit that your health care provider tells you. ? Avoid physical exercise and any movement that requires effort (is strenuous).  To help prevent constipation: ? Eat a healthy diet that includes fruits, vegetables, and whole grains. ? Drink 6-8 glasses of water per day. Get help right away if:  You develop worsening pain that is not relieved by medicine.  You have: ? A fever or chills. ? Vaginal bleeding. ? Redness and swelling at the incision site. ? Nausea and vomiting.  You feel dizzy or weak.  You feel light-headed or you faint. This information is not intended to replace advice given to you by your health care provider. Make sure you discuss any questions you have with your health care provider. Document Released: 05/21/2004 Document Revised: 12/11/2015 Document Reviewed: 11/13/2015 Elsevier Interactive Patient Education  [employer].

## 2018-08-14 NOTE — MAU Note (Signed)
Victoria Trujillo is a 33 y.o. at [redacted]w[redacted]d here in MAU reporting: here for follow up hcg, denies any pain or bleeding  Pain score: 0/10  Vitals:   08/14/18 1347  BP: 125/68  Pulse: 89  Resp: 18  Temp: 99.3 F (37.4 C)  SpO2: 100%      Lab orders placed from triage: hcg order released

## 2018-08-14 NOTE — MAU Provider Note (Signed)
Subjective:  Victoria Trujillo is a 33 y.o. [phone] at [redacted]w[redacted]d who presents today for FU BHCG. She was seen on 08/10/2018 for abdominal pain and vaginal bleeding. Results from that day show no IUP or extrauterine pregnancy on [place], and HCG 435. Pt returned to MAU for repeat hCG on 08/12/2018 and repeat hCG was 560. Pt returned to MAU today for third hCG - 677. She denies vaginal bleeding. She denies abdominal or pelvic pain.  Objective:  Physical Exam  Nursing note and vitals reviewed.  Patient Vitals for the past 24 hrs:  BP Temp Temp src Pulse Resp SpO2  08/14/18 1658 117/60 - - 73 - -  08/14/18 1520 - 98.9 F (37.2 C) Oral - - -  08/14/18 1347 125/68 99.3 F (37.4 C) Oral 89 18 100 %   Constitutional: She is oriented to person, place, and time. She appears well-developed and well-nourished. No distress.  HENT:  Head: Normocephalic.  Cardiovascular: Normal rate.  Respiratory: Effort normal.  GI: Soft. There is no tenderness.  Neurological: She is alert and oriented to person, place, and time. Skin: Skin is warm and dry.  Psychiatric: She has a normal mood and affect.   Results for orders placed or performed during the hospital encounter of 08/14/18 (from the past 24 hour(s))  hCG, quantitative, pregnancy     Status: Abnormal   Collection Time: 08/14/18  1:43 PM  Result Value Ref Range   hCG, Beta Chain, Quant, S 677 (H) <5 mIU/mL  CBC WITH DIFFERENTIAL     Status: None   Collection Time: 08/14/18  3:27 PM  Result Value Ref Range   WBC 7.6 4.0 - 10.5 K/uL   RBC 4.62 3.87 - 5.11 MIL/uL   Hemoglobin 12.8 12.0 - 15.0 g/dL   HCT 40.4 36.0 - 46.0 %   MCV 87.4 80.0 - 100.0 fL   MCH 27.7 26.0 - 34.0 pg   MCHC 31.7 30.0 - 36.0 g/dL   RDW 12.7 11.5 - 15.5 %   Platelets 387 150 - 400 K/uL   nRBC 0.0 0.0 - 0.2 %   Neutrophils Relative % 64 %   Neutro Abs 4.8 1.7 - 7.7 K/uL   Lymphocytes Relative 28 %   Lymphs Abs 2.1 0.7 - 4.0 K/uL   Monocytes Relative 6 %   Monocytes Absolute 0.5 0.1  - 1.0 K/uL   Eosinophils Relative 2 %   Eosinophils Absolute 0.2 0.0 - 0.5 K/uL   Basophils Relative 0 %   Basophils Absolute 0.0 0.0 - 0.1 K/uL   Immature Granulocytes 0 %   Abs Immature Granulocytes 0.02 0.00 - 0.07 K/uL  Comprehensive metabolic panel     Status: None   Collection Time: 08/14/18  3:27 PM  Result Value Ref Range   Sodium 137 135 - 145 mmol/L   Potassium 4.0 3.5 - 5.1 mmol/L   Chloride 106 98 - 111 mmol/L   CO2 22 22 - 32 mmol/L   Glucose, Bld 97 70 - 99 mg/dL   BUN 10 6 - 20 mg/dL   Creatinine, Ser 0.88 0.44 - 1.00 mg/dL   Calcium 9.7 8.9 - 10.3 mg/dL   Total Protein 7.4 6.5 - 8.1 g/dL   Albumin 4.3 3.5 - 5.0 g/dL   AST 21 15 - 41 U/L   ALT 20 0 - 44 U/L   Alkaline Phosphatase 48 38 - 126 U/L   Total Bilirubin 0.7 0.3 - 1.2 mg/dL   GFR calc non Af Amer >  60 >60 mL/min   GFR calc Af Amer >60 >60 mL/min   Anion gap 9 5 - 15   Assessment/Plan: Pregnancy of unknown location, ectopic suspected based on hCG HCG did not rise appropriately -per consultation with Dr. Harolyn Rutherford, offer methotrexate as preferred treatment option, pt can  also do expectant management with repeat hCG in 48hrs in clinic as desired. -after discussion per below, pt elects methotrexate today -pt discharged to home in stable condition  The risks of methotrexate were reviewed including failure requiring repeat dosing or eventual surgery. She understands that methotrexate involves frequent return visits to monitor lab values and that she remains at risk of ectopic rupture until her beta is less than assay. ?The patient opts to proceed with methotrexate. She has no history of hepatic or renal dysfunction, has normal BUN/Cr/LFT's/platelets. She is felt to be reliable for follow-up. Side effects of photosensitivity & GI upset were discussed. She knows to avoid direct sunlight and abstain from alcohol, NSAIDs and sexual intercourse for two weeks. She was counseled to discontinue any MVI with folic  acid. ?She understands to follow up on D4 (Wednesday 08/17/2018, appt made @ [facility] office for hCG @1400 ) and D7 (Saturday 08/20/2018, repeat hCG to be in MAU) for repeat BHCG and was given the instruction sheet. ?Strict ectopic precautions were reviewed, the patient knows to call with any abdominal pain, vomiting, fainting, or any concerns with her health.  Day 0/1 Day 4 Day 7  Sunday Wednesday Saturday  Monday Thursday Sunday  Tuesday Friday Monday  Wednesday Saturday Tuesday  Thursday Sunday Wednesday  Friday Monday Thursday  Saturday Tuesday Friday   Methotrexate Treatment Protocol for Ectopic Pregnancy  xPretreatment testing and instructions  xhCG concentration (677) xTransvaginal ultrasound (performed 08/10/2018, no IUP)           -per discussion with Dr. Harolyn Rutherford, repeat US not necessary today prior to             methotrexate administration xBlood group and Rh(D) typing (A Positive) xComplete blood count  (WNL) xLiver and renal function tests (WNL) xDiscontinue folic acid supplements  xCounsel patient to avoid NSAIDs, recommend acetaminophen if an analgesic is needed  xAdvise patient to refrain from sexual intercourse and strenuous exercise  Treatment day  Single dose protocol   1  hCG.  Administer Methotrexate 50 mg/m2 body surface area IM  4  hCG  7  hCG  If <15 percent hCG decline from day 4 to 7, give additional dose of methotrexate 50 mg/m2 IM  If ?15 percent hCG decline from day 4 to 7, draw hCG weekly until undetectable  14  hCG  If <15 percent hCG decline from day 7 to 14, give additional dose of methotrexate 50 mg/m2 IM  If ?15 percent hCG decline from day 7 to 14, check hCG weekly until undetectable  21 and 28  If 3 doses have been given and there is a <15 percent hCG decline from day 21 to 28, proceed with laparoscopic surgery  Laparoscopy  If severe abdominal pain or an acute abdomen suggestive of tubal rupture occurs If ultrasonography reveals greater than 300 mL  pelvic or other intraperitoneal fluid  The hCG concentration usually declines to less than 15 mIU/mL by 35 days postinjection but may take as long as 109 days. If the hCG does not decline to zero, a new pregnancy should be excluded; if the hCG is rising, a transvaginal ultrasound should be performed. Alternatively, some patients have a slow clearance of  serum hCG. If three weekly values are similar, consider an additional dose of MTX (50 mg/m2) not to exceed the recommended maximum of three total doses. This typically accelerates the decline of serum hCG. The risk of gestational trophoblastic disease is low. Folinic acid rescue is not required for women treated with the single-dose protocol, even if multiple doses are ultimately given.   Prepared with data from:   [facility]. Clinical practice. Ectopic pregnancy. [place] Med 2009; 361:379  American College of Obstetricians and Gynecologists. ACOG Practice Bulletin No. 94: Medical management of ectopic pregnancy. Obstet Gynecol 2008; 355:9741.

## 2018-08-15 NOTE — Telephone Encounter (Signed)
Phone note opened in error.  Clarisa Fling, NP  10:02 AM 08/15/2018

## 2018-08-16 ENCOUNTER — Telehealth: Payer: Self-pay | Admitting: Family Medicine

## 2018-08-16 NOTE — Telephone Encounter (Signed)
Attempted to call patient with the office's new address and process change. No answer, left detailed message with this information and to give the office a call if there were nay questions or concerns.

## 2018-08-17 ENCOUNTER — Other Ambulatory Visit: Payer: Self-pay

## 2018-08-17 ENCOUNTER — Encounter: Payer: Self-pay | Admitting: Obstetrics & Gynecology

## 2018-08-17 ENCOUNTER — Other Ambulatory Visit: Payer: Medicaid Other

## 2018-08-17 ENCOUNTER — Ambulatory Visit (INDEPENDENT_AMBULATORY_CARE_PROVIDER_SITE_OTHER): Payer: Self-pay | Admitting: *Deleted

## 2018-08-17 DIAGNOSIS — O3680X0 Pregnancy with inconclusive fetal viability, not applicable or unspecified: Secondary | ICD-10-CM

## 2018-08-17 LAB — BETA HCG QUANT (REF LAB): hCG Quant: 666 m[IU]/mL

## 2018-08-17 NOTE — Progress Notes (Signed)
Pt here for day 4 after Methotrexate bhcg lab.  Pt denies bleeding or pain.  Pt reports that the number she can be reached at is [phone]  STAT bhcg resulted at 677.  Reviewed with Dr. Ilda Basset who recommended that pt follow up per protocol for day 7 post methotrexate labs at MAU on Saturday 08/20/18.  Strict ectopic precautions given.  Pt verbalized understanding.

## 2018-08-20 ENCOUNTER — Inpatient Hospital Stay (HOSPITAL_COMMUNITY)
Admission: AD | Admit: 2018-08-20 | Discharge: 2018-08-20 | Disposition: A | Discharge status: Home / Self Care | Payer: Medicaid Other | Attending: Obstetrics and Gynecology | Admitting: Obstetrics and Gynecology

## 2018-08-20 ENCOUNTER — Other Ambulatory Visit: Payer: Self-pay

## 2018-08-20 DIAGNOSIS — O00109 Unspecified tubal pregnancy without intrauterine pregnancy: Secondary | ICD-10-CM

## 2018-08-20 DIAGNOSIS — O009 Unspecified ectopic pregnancy without intrauterine pregnancy: Secondary | ICD-10-CM | POA: Insufficient documentation

## 2018-08-20 DIAGNOSIS — Z3A08 8 weeks gestation of pregnancy: Secondary | ICD-10-CM | POA: Insufficient documentation

## 2018-08-20 LAB — CBC WITH DIFFERENTIAL/PLATELET
Abs Immature Granulocytes: 0.01 10*3/uL (ref 0.00–0.07)
Basophils Absolute: 0.1 10*3/uL (ref 0.0–0.1)
Basophils Relative: 1 %
Eosinophils Absolute: 0.1 10*3/uL (ref 0.0–0.5)
Eosinophils Relative: 2 %
HCT: 38.6 % (ref 36.0–46.0)
Hemoglobin: 12.6 g/dL (ref 12.0–15.0)
Immature Granulocytes: 0 %
Lymphocytes Relative: 40 %
Lymphs Abs: 2.3 10*3/uL (ref 0.7–4.0)
MCH: 29 pg (ref 26.0–34.0)
MCHC: 32.6 g/dL (ref 30.0–36.0)
MCV: 88.7 fL (ref 80.0–100.0)
Monocytes Absolute: 0.5 10*3/uL (ref 0.1–1.0)
Monocytes Relative: 8 %
Neutro Abs: 2.8 10*3/uL (ref 1.7–7.7)
Neutrophils Relative %: 49 %
Platelets: 355 10*3/uL (ref 150–400)
RBC: 4.35 MIL/uL (ref 3.87–5.11)
RDW: 13 % (ref 11.5–15.5)
WBC: 5.7 10*3/uL (ref 4.0–10.5)
nRBC: 0 % (ref 0.0–0.2)

## 2018-08-20 LAB — COMPREHENSIVE METABOLIC PANEL
ALT: 36 U/L (ref 0–44)
AST: 28 U/L (ref 15–41)
Albumin: 4 g/dL (ref 3.5–5.0)
Alkaline Phosphatase: 43 U/L (ref 38–126)
Anion gap: 10 (ref 5–15)
BUN: 12 mg/dL (ref 6–20)
CO2: 23 mmol/L (ref 22–32)
Calcium: 9.3 mg/dL (ref 8.9–10.3)
Chloride: 104 mmol/L (ref 98–111)
Creatinine, Ser: 0.69 mg/dL (ref 0.44–1.00)
GFR calc Af Amer: >60 mL/min (ref >60)
GFR calc non Af Amer: >60 mL/min (ref >60)
Glucose, Bld: 82 mg/dL (ref 70–99)
Potassium: 3.8 mmol/L (ref 3.5–5.1)
Sodium: 137 mmol/L (ref 135–145)
Total Bilirubin: 0.6 mg/dL (ref 0.3–1.2)
Total Protein: 7.4 g/dL (ref 6.5–8.1)

## 2018-08-20 LAB — HCG, QUANTITATIVE, PREGNANCY: hCG, Beta Chain, Quant, S: 675 m[IU]/mL — ABNORMAL HIGH (ref <5)

## 2018-08-20 MED ORDER — METHOTREXATE FOR ECTOPIC PREGNANCY
50.0000 mg/m2 | Freq: Once | INTRAMUSCULAR | Status: AC
  Administered 2018-08-20: 110 mg via INTRAMUSCULAR
  Filled 2018-08-20: qty 1

## 2018-08-20 NOTE — MAU Note (Signed)
Pt to room 121 for CBC, CMP, and methotrexate

## 2018-08-20 NOTE — MAU Note (Signed)
Pt here for day 7 lab draw after ectopic treatment.  Pt denies pain and vag bleeding.

## 2018-08-20 NOTE — Discharge Instructions (Signed)
Methotrexate Treatment for an Ectopic Pregnancy, Care After This sheet gives you information about how to care for yourself after your procedure. Your health care provider may also give you more specific instructions. If you have problems or questions, contact your health care provider. What can I expect after the procedure? After the procedure, it is common to have:  Abdominal cramping.  Vaginal bleeding.  Fatigue.  Nausea.  Vomiting.  Diarrhea. Blood tests will be taken at timed intervals for several days or weeks to check your pregnancy hormone levels. The blood tests will be done until the pregnancy hormone can no longer be detected in the blood. Follow these instructions at home: Activity  Do not have sex until your health care provider approves.  Limit activities that take a lot of effort as told by your health care provider. Medicines  Take over the counter and prescription medicines only as told by your health care provider.  Do not take aspirin, ibuprofen, naproxen, or any other NSAIDs.  Do not take folic acid, prenatal vitamins, or other vitamins that contain folic acid. General instructions   Do not drink alcohol.  Follow instructions from your health care provider on how and when to report any symptoms that may indicate a ruptured ectopic pregnancy.  Keep all follow-up visits as told by your health care provider. This is important. Contact a health care provider if:  You have persistent nausea and vomiting.  You have persistent diarrhea.  You are having a reaction to the medicine, such as: ? Tiredness. ? Skin rash. ? Hair loss. Get help right away if:  Your abdominal or pelvic pain gets worse.  You have more vaginal bleeding.  You feel light-headed or you faint.  You have shortness of breath.  Your heart rate increases.  You develop a cough.  You have chills.  You have a fever. Summary  After the procedure, it is common to have symptoms  of abdominal cramping, vaginal bleeding and fatigue. You may also experience other symptoms.  Blood tests will be taken at timed intervals for several days or weeks to check your pregnancy hormone levels. The blood tests will be done until the pregnancy hormone can no longer be detected in the blood.  Limit strenuous activity as told by your health care provider.  Follow instructions from your health care provider on how and when to report any symptoms that may indicate a ruptured ectopic pregnancy. This information is not intended to replace advice given to you by your health care provider. Make sure you discuss any questions you have with your health care provider. Document Released: 04/02/2011 Document Revised: 06/02/2016 Document Reviewed: 06/02/2016 Elsevier Interactive Patient Education  2019 [employer].

## 2018-08-20 NOTE — MAU Provider Note (Signed)
Victoria Trujillo  is a 33 y.o. [id number] at [redacted]w[redacted]d who presents to MAU today for follow-up quant hCG on day#7 after MTX for possible ectopic pregnancy. Patient had serial hCG levels x 3 with minimal rise. She denies pain today. She states spotting yesterday but none today. She denies fever. Day#4 hcg level in the office was 666.   BP 104/67 (BP Location: Right Arm)   Pulse 68   Temp 98.6 F (37 C) (Oral)   Resp 18   Ht 5\' 5"  (1.651 m)   Wt 106.6 kg   LMP 06/24/2018   SpO2 100%   BMI 39.12 kg/m   CONSTITUTIONAL: Well-developed, well-nourished female in no acute distress.  EYES: EOM intact, conjunctivae normal.  CARDIOVASCULAR: Regular heart rate ABDOMEN: soft, non-tender, no masses. No guarding.  RESPIRATORY: Normal effort NEUROLOGICAL: Alert and oriented to person, place, and time.  SKIN: Skin is warm and dry. No rash noted. Not diaphoretic. No erythema. No pallor. PSYCH: Normal mood and affect. Normal behavior. Normal judgment and thought content.  Results for orders placed or performed during the hospital encounter of 08/20/18 (from the past 24 hour(s))  hCG, quantitative, pregnancy     Status: Abnormal   Collection Time: 08/20/18  8:36 AM  Result Value Ref Range   hCG, Beta Chain, Quant, S 675 (H) <5 mIU/mL  CBC with Differential/Platelet     Status: None   Collection Time: 08/20/18 10:14 AM  Result Value Ref Range   WBC 5.7 4.0 - 10.5 K/uL   RBC 4.35 3.87 - 5.11 MIL/uL   Hemoglobin 12.6 12.0 - 15.0 g/dL   HCT 38.6 36.0 - 46.0 %   MCV 88.7 80.0 - 100.0 fL   MCH 29.0 26.0 - 34.0 pg   MCHC 32.6 30.0 - 36.0 g/dL   RDW 13.0 11.5 - 15.5 %   Platelets 355 150 - 400 K/uL   nRBC 0.0 0.0 - 0.2 %   Neutrophils Relative % 49 %   Neutro Abs 2.8 1.7 - 7.7 K/uL   Lymphocytes Relative 40 %   Lymphs Abs 2.3 0.7 - 4.0 K/uL   Monocytes Relative 8 %   Monocytes Absolute 0.5 0.1 - 1.0 K/uL   Eosinophils Relative 2 %   Eosinophils Absolute 0.1 0.0 - 0.5 K/uL   Basophils Relative 1 %   Basophils Absolute 0.1 0.0 - 0.1 K/uL   Immature Granulocytes 0 %   Abs Immature Granulocytes 0.01 0.00 - 0.07 K/uL  Comprehensive metabolic panel     Status: None   Collection Time: 08/20/18 10:14 AM  Result Value Ref Range   Sodium 137 135 - 145 mmol/L   Potassium 3.8 3.5 - 5.1 mmol/L   Chloride 104 98 - 111 mmol/L   CO2 23 22 - 32 mmol/L   Glucose, Bld 82 70 - 99 mg/dL   BUN 12 6 - 20 mg/dL   Creatinine, Ser 0.69 0.44 - 1.00 mg/dL   Calcium 9.3 8.9 - 10.3 mg/dL   Total Protein 7.4 6.5 - 8.1 g/dL   Albumin 4.0 3.5 - 5.0 g/dL   AST 28 15 - 41 U/L   ALT 36 0 - 44 U/L   Alkaline Phosphatase 43 38 - 126 U/L   Total Bilirubin 0.6 0.3 - 1.2 mg/dL   GFR calc non Af Amer >60 >60 mL/min   GFR calc Af Amer >60 >60 mL/min   Anion gap 10 5 - 15    MDM MSE complete. Patient advised that  she is in stable condition and can wait in her car until labs have resulted to avoid long-term exposure to multiple other patients in the waiting area.  Discussed patient with Dr. Ilda Basset. He recommends repeat MTX today.  Patient called and will return to MAU.  CBC, CMP ordered - normal MTX ordered   A: Ectopic pregnancy s/p MTX without appropriate decline in hCG after MTX x 1  P: Discharge home Second dose MTX given today Patient to follow-up with [facility] on Tuesday for Day#4 hCG STAT at 8:30am Ectopic precautions reviewed  Patient may return to MAU as needed or if her condition were to change or worsen   Luvenia Redden, PA-C 08/20/2018 11:20 AM

## 2018-08-20 NOTE — MAU Note (Signed)
Pt okay to leave per Gerlach PA. Provider will call patient with results and follow up plan of care. Pt to remain on the board until results are back.

## 2018-08-22 NOTE — Progress Notes (Signed)
I have reviewed the chart and agree with nursing staff's documentation of this patient's encounter.  Aletha Halim, MD 08/22/2018 9:22 AM

## 2018-08-23 ENCOUNTER — Other Ambulatory Visit: Payer: Self-pay

## 2018-08-23 ENCOUNTER — Telehealth: Payer: Self-pay | Admitting: Lactation Services

## 2018-08-23 ENCOUNTER — Ambulatory Visit: Payer: Medicaid Other

## 2018-08-23 DIAGNOSIS — O3680X0 Pregnancy with inconclusive fetal viability, not applicable or unspecified: Secondary | ICD-10-CM

## 2018-08-23 LAB — BETA HCG QUANT (REF LAB): hCG Quant: 536 m[IU]/mL

## 2018-08-23 NOTE — Telephone Encounter (Signed)
Pt called in to say she had missed the call with her results and requests to be called back with results. Victoria Trujillo notified and she will call pt back.

## 2018-08-23 NOTE — Progress Notes (Signed)
Pt here today for STAT Beta Lab s/p tubal pregnancy.  Pt denies any pain or bleeding.  Pt informed that she will be sent home for results due to [employer] and it will take min 2 hours.  I explained to the pt that I will call her results and f/u.   Pt agreed.   Received critical value of stat beta lab 536.  Notified Dr. Harolyn Rutherford who states that pt beta level is declining appropriate with MTX tx and we will have pt to come back on 08/26/18 for day 7 STAT beta lab.   Notified pt provider's recommendation and [place] appt.  Pt stated understanding with no further questions.

## 2018-08-26 ENCOUNTER — Other Ambulatory Visit: Payer: Self-pay

## 2018-08-26 ENCOUNTER — Ambulatory Visit (INDEPENDENT_AMBULATORY_CARE_PROVIDER_SITE_OTHER): Payer: Self-pay | Admitting: *Deleted

## 2018-08-26 DIAGNOSIS — O3680X0 Pregnancy with inconclusive fetal viability, not applicable or unspecified: Secondary | ICD-10-CM

## 2018-08-26 DIAGNOSIS — O00109 Unspecified tubal pregnancy without intrauterine pregnancy: Secondary | ICD-10-CM

## 2018-08-26 LAB — BETA HCG QUANT (REF LAB): hCG Quant: 475 m[IU]/mL

## 2018-08-26 NOTE — Progress Notes (Signed)
Agree with A & P. 

## 2018-08-26 NOTE — Progress Notes (Signed)
Pt presents to clinic for STAT bhcg day 7 s/p MTX.  Pt denies any pain or bleeding.  Pt advised that it takes 1-2 hours for the results to come back and she will be contacted at that time.  Pt states that a good number for her to be reached at is [phone].  Pt would also like to discuss starting birth control.  Advised pt that she would be scheduled for a video visit with a provider to discuss the birth control options available to her.  Pt verbalized understanding.   STAT bchg resulted at 475.  Reviewed with Dr. Rip Harbour and he stated appropriate decrease in bhcg.  Pt to follow up weekly until bhcg is 0.  Discussed plan with pt and pt verbalized understanding.

## 2018-08-29 ENCOUNTER — Ambulatory Visit: Payer: Medicaid Other

## 2019-04-24 ENCOUNTER — Encounter: Payer: Self-pay | Admitting: Emergency Medicine

## 2019-04-24 ENCOUNTER — Other Ambulatory Visit: Payer: Self-pay

## 2019-04-24 ENCOUNTER — Ambulatory Visit
Admission: EM | Admit: 2019-04-24 | Discharge: 2019-04-24 | Disposition: A | Discharge status: Home / Self Care | Payer: Medicaid Other

## 2019-04-24 ENCOUNTER — Telehealth: Payer: Medicaid Other

## 2019-04-24 DIAGNOSIS — Z3A01 Less than 8 weeks gestation of pregnancy: Secondary | ICD-10-CM

## 2019-04-24 DIAGNOSIS — Z3201 Encounter for pregnancy test, result positive: Secondary | ICD-10-CM

## 2019-04-24 LAB — POCT URINE PREGNANCY: Preg Test, Ur: POSITIVE — AB

## 2019-04-24 NOTE — ED Provider Notes (Signed)
[facility]    CSN: [id number] Arrival date & time: 04/24/19  1622      History   Chief Complaint Chief Complaint  Patient presents with  . Positive Home Pregnancy Test    HPI Victoria Trujillo is a 33 y.o. female presenting for pregnancy test.  States that she had a positive one at home a few days ago.  LMP 03/21/2019.  Patient does have history of ectopic pregnancy in May of this year: She resolved.  Patient denies abdominal, pelvic pain, back pain, fever.  No vaginal discharge or bleeding. Patient currently taking generic multivitamin, vitamin C: No prenatal vitamin.  Past Medical History:  Diagnosis Date  . Headache(784.0)   . Infection    UTI  . Ovarian cyst   . Pregnancy induced hypertension     Patient Active Problem List   Diagnosis Date Noted  . Dermoid cyst of ovary, right 02/22/2013    Past Surgical History:  Procedure Laterality Date  . LAPAROSCOPIC OVARIAN CYSTECTOMY Right 02/02/2017   Procedure: LAPAROSCOPIC OVARIAN CYSTECTOMY;  Surgeon: Donnamae Jude, MD;  Location: [facility];  Service: Gynecology;  Laterality: Right;  . THERAPEUTIC ABORTION    . WISDOM TOOTH EXTRACTION      OB History    Gravida  4   Para  2   Term  2   Preterm  0   AB  1   Living  2     SAB  0   TAB  1   Ectopic  0   Multiple  0   Live Births  2            Home Medications    Prior to Admission medications   Medication Sig Start Date End Date Taking? Authorizing Provider  Ascorbic Acid (VITAMIN C) 1000 MG tablet Take 1,000 mg by mouth daily.   Yes [provider]  Multiple Vitamins-Minerals (MULTIVITAMIN WITH MINERALS) tablet Take 1 tablet by mouth daily.   Yes [provider]  Probiotic Product (PROBIOTIC-10 PO) Take by mouth.   Yes [provider]    Family History Family History  Problem Relation Age of Onset  . Hypertension Mother   . Diabetes Father   . Hypertension Father   . Heart disease Father   .  Stroke Father   . Hypertension Maternal Grandmother   . Cancer Maternal Grandmother   . Kidney disease Maternal Grandfather     Social History Social History   Tobacco Use  . Smoking status: Former Research scientist (life sciences)  . Smokeless tobacco: Never Used  . Tobacco comment: not daily  Substance Use Topics  . Alcohol use: Yes  . Drug use: No     Allergies   Patient has no known allergies.   Review of Systems Review of Systems  Constitutional: Negative for fatigue and fever.  HENT: Negative for ear pain, sinus pain, sore throat and voice change.   Eyes: Negative for pain, redness and visual disturbance.  Respiratory: Negative for cough and shortness of breath.   Cardiovascular: Negative for chest pain and palpitations.  Gastrointestinal: Negative for abdominal pain, constipation, diarrhea and vomiting.  Genitourinary: Negative for dysuria, flank pain, frequency, hematuria, pelvic pain, urgency, vaginal bleeding, vaginal discharge and vaginal pain.  Musculoskeletal: Negative for arthralgias and myalgias.  Skin: Negative for rash and wound.  Neurological: Negative for syncope and headaches.     Physical Exam Triage Vital Signs ED Triage Vitals  Enc Vitals Group     BP  04/24/19 1652 122/77     Pulse Rate 04/24/19 1652 96     Resp 04/24/19 1652 18     Temp 04/24/19 1652 97.8 F (36.6 C)     Temp Source 04/24/19 1652 Temporal     SpO2 04/24/19 1652 96 %     Weight --      Height --      Head Circumference --      Peak Flow --      Pain Score 04/24/19 1654 0     Pain Loc --      Pain Edu? --      Excl. in [place]? --    No data found.  Updated Vital Signs BP 122/77 (BP Location: Left Arm)   Pulse 96   Temp 97.8 F (36.6 C) (Temporal)   Resp 18   LMP 03/21/2019   SpO2 96%   Breastfeeding Unknown   Visual Acuity Right Eye Distance:   Left Eye Distance:   Bilateral Distance:    Right Eye Near:   Left Eye Near:    Bilateral Near:     Physical Exam Constitutional:       General: She is not in acute distress. HENT:     Head: Normocephalic and atraumatic.  Eyes:     General: No scleral icterus.    Pupils: Pupils are equal, round, and reactive to light.  Cardiovascular:     Rate and Rhythm: Normal rate.  Pulmonary:     Effort: Pulmonary effort is normal.  Abdominal:     General: Bowel sounds are normal.     Palpations: Abdomen is soft.     Tenderness: There is no abdominal tenderness. There is no right CVA tenderness, left CVA tenderness or guarding.  Skin:    Coloration: Skin is not jaundiced or pale.  Neurological:     Mental Status: She is alert and oriented to person, place, and time.      UC Treatments / Results  Labs (all labs ordered are listed, but only abnormal results are displayed) Labs Reviewed  POCT URINE PREGNANCY - Abnormal; Notable for the following components:      Result Value   Preg Test, Ur Positive (*)    All other components within normal limits    EKG   Radiology No results found.  Procedures Procedures (including critical care time)  Medications Ordered in UC Medications - No data to display  Initial Impression / Assessment and Plan / UC Course  I have reviewed the triage vital signs and the nursing notes.  Pertinent labs & imaging results that were available during my care of the patient were reviewed by me and considered in my medical decision making (see chart for details).     Patient appears well: POC urine pregnancy test in office, reviewed by me-positive.  Will discontinue vitamin C, multivitamin and start prenatal vitamin instead.  Provided patient with occasional handout on safe OTC medications in pregnancy as well as contact information for MAU for emergencies and women's outpatient clinic for routine prenatal care.  Return precautions discussed, patient verbalized understanding and is agreeable to plan. Final Clinical Impressions(s) / UC Diagnoses   Final diagnoses:  Less than [redacted] weeks gestation  of pregnancy     Discharge Instructions     Recommend you start a prenatal vitamin. Important to call OB/GYN to establish care for prenatal care. Provided address for [facility] should you develop severe abdominal pain, vaginal bleeding.    ED  Prescriptions    None     PDMP not reviewed this encounter.   Hall[place], [place], [place] 04/24/19 1742

## 2019-04-24 NOTE — ED Triage Notes (Signed)
Pt presents to [facility] for assessment of positive pregnancy test at home.  Recent ectopic.

## 2019-04-24 NOTE — Discharge Instructions (Addendum)
Recommend you start a prenatal vitamin. Important to call OB/GYN to establish care for prenatal care. Provided address for [facility] should you develop severe abdominal pain, vaginal bleeding.

## 2019-04-28 NOTE — L&D Delivery Note (Addendum)
OB/GYN Faculty Practice Delivery Note  Victoria Trujillo is a 34 y.o. [id number] s/p SVD at [redacted]w[redacted]d. She was admitted for PROM at 2300 on 12/14/19.   ROM: 4h 105m with clear fluid GBS Status: Negative Maximum Maternal Temperature: 98.7  Labor Progress: Patient presented to MAU after PROM with clear fluid at 2300 on 12/14/19. She was confirmed ruptured and dilated to 4cm on her initial check. Upon arrival on the labor floor she was 8cm dilated and received an epidural. She progressed to complete at 0220 without labor augmentation.   Delivery Date/Time: 12/15/19 02:20 Delivery: Called to room and patient was complete and pushing. Head delivered LOA. Nuchal cord present x 1, easily reduced. Shoulder and body delivered in usual fashion. Infant with spontaneous cry, placed on mother's abdomen, dried and stimulated. Cord clamped x 2 after 1-minute delay, and cut by FOB under my direct supervision. Cord blood drawn. Placenta delivered spontaneously with gentle cord traction. Fundus firm with massage and Pitocin. Labia, perineum, vagina, and cervix were inspected, bilateral periurethral abrasions present and hemostatic without repair.   Placenta: Intact, 3 vessel cord, delivered spontaneously Complications: None Lacerations: Bilateral periurethral abrasions, hemostatic without repair EBL: 200 Analgesia: Epidural  Infant: Boy  APGARs 9 and 9  Weight pending   Wende Mott, MD Family Medicine PGY-3  The above was performed under my direct supervision and guidance.

## 2019-05-22 ENCOUNTER — Ambulatory Visit (INDEPENDENT_AMBULATORY_CARE_PROVIDER_SITE_OTHER): Payer: Self-pay | Admitting: *Deleted

## 2019-05-22 ENCOUNTER — Other Ambulatory Visit: Payer: Self-pay

## 2019-05-22 DIAGNOSIS — O09299 Supervision of pregnancy with other poor reproductive or obstetric history, unspecified trimester: Secondary | ICD-10-CM | POA: Insufficient documentation

## 2019-05-22 DIAGNOSIS — O0990 Supervision of high risk pregnancy, unspecified, unspecified trimester: Secondary | ICD-10-CM | POA: Insufficient documentation

## 2019-05-22 DIAGNOSIS — O99210 Obesity complicating pregnancy, unspecified trimester: Secondary | ICD-10-CM | POA: Insufficient documentation

## 2019-05-22 DIAGNOSIS — Z8759 Personal history of other complications of pregnancy, childbirth and the puerperium: Secondary | ICD-10-CM

## 2019-05-22 NOTE — Progress Notes (Signed)
I connected with  Juluis Pitch on [id number] at  8:30 AM EST by telephone and verified that I am speaking with the correct person using two identifiers.   I discussed the limitations, risks, security and privacy concerns of performing an evaluation and management service by telephone and the availability of in person appointments. I also discussed with the patient that there may be a patient responsible charge related to this service. The patient expressed understanding and agreed to proceed. Explained I am completing her New OB Intake today. We discussed Her EDD and that it is based on  sure LMP . She states LMP could have been 1-2 before.. I reviewed her allergies, meds, OB History, Medical /Surgical history, and appropriate screenings. I informed her of [facility] services.  I explained I will send her the [employer] app- app sent to her while on phone and she will download after call.  I explained we will send a blood pressure cuff to [facility] once her medicaid is approved and then  we will show her how to use it. Explained  then we will have her take her blood pressure weekly and enter into the app. Explained she will have some visits in office and some virtually. She already has Community education officer. I reviewed her new ob  appointment date/ time with her , our location and to wear mask, no visitors.  I explained she will have a pelvic exam, ob bloodwork, hemoglobin a1C, cbg , genetic testing if desired,- she does want a panorama,  pap if needed. I scheduled an [place] at 19 weeks and gave her the appointment. She voices understanding.   Yavier Snider,RN 05/22/2019  8:34 AM

## 2019-05-22 NOTE — Patient Instructions (Signed)

## 2019-05-22 NOTE — Progress Notes (Signed)
Patient seen and assessed by nursing staff during this encounter. I have reviewed the chart and agree with the documentation and plan.  Verita Schneiders, MD 05/22/2019 9:14 AM

## 2019-06-08 ENCOUNTER — Encounter: Payer: Self-pay | Admitting: Obstetrics & Gynecology

## 2019-06-08 ENCOUNTER — Other Ambulatory Visit: Payer: Self-pay

## 2019-06-08 ENCOUNTER — Ambulatory Visit (INDEPENDENT_AMBULATORY_CARE_PROVIDER_SITE_OTHER): Payer: Medicaid Other | Admitting: Obstetrics & Gynecology

## 2019-06-08 ENCOUNTER — Other Ambulatory Visit (HOSPITAL_COMMUNITY)
Admission: RE | Admit: 2019-06-08 | Discharge: 2019-06-08 | Disposition: A | Discharge status: Home / Self Care | Payer: Medicaid Other | Source: Ambulatory Visit | Attending: Obstetrics & Gynecology | Admitting: Obstetrics & Gynecology

## 2019-06-08 VITALS — BP 117/67 | HR 80 | Wt 234.0 lb

## 2019-06-08 DIAGNOSIS — O99211 Obesity complicating pregnancy, first trimester: Secondary | ICD-10-CM

## 2019-06-08 DIAGNOSIS — O219 Vomiting of pregnancy, unspecified: Secondary | ICD-10-CM | POA: Diagnosis not present

## 2019-06-08 DIAGNOSIS — O0991 Supervision of high risk pregnancy, unspecified, first trimester: Secondary | ICD-10-CM | POA: Diagnosis not present

## 2019-06-08 DIAGNOSIS — B9689 Other specified bacterial agents as the cause of diseases classified elsewhere: Secondary | ICD-10-CM

## 2019-06-08 DIAGNOSIS — O09299 Supervision of pregnancy with other poor reproductive or obstetric history, unspecified trimester: Secondary | ICD-10-CM

## 2019-06-08 DIAGNOSIS — O09291 Supervision of pregnancy with other poor reproductive or obstetric history, first trimester: Secondary | ICD-10-CM

## 2019-06-08 DIAGNOSIS — O0990 Supervision of high risk pregnancy, unspecified, unspecified trimester: Secondary | ICD-10-CM | POA: Diagnosis not present

## 2019-06-08 DIAGNOSIS — O99210 Obesity complicating pregnancy, unspecified trimester: Secondary | ICD-10-CM

## 2019-06-08 DIAGNOSIS — Z3A11 11 weeks gestation of pregnancy: Secondary | ICD-10-CM

## 2019-06-08 MED ORDER — VITAFOL GUMMIES 3.33-0.333-34.8 MG PO CHEW: 1.0000 | CHEWABLE_TABLET | Freq: Every day | ORAL | 5 refills | Status: AC

## 2019-06-08 MED ORDER — PROMETHAZINE HCL 25 MG PO TABS: 25.0000 mg | ORAL_TABLET | Freq: Four times a day (QID) | ORAL | 2 refills | Status: DC | PRN

## 2019-06-08 MED ORDER — BLOOD PRESSURE KIT DEVI: 1.0000 | Freq: Once | 0 refills | Status: DC

## 2019-06-08 MED ORDER — BLOOD PRESSURE KIT DEVI: 1.0000 | Freq: Once | 0 refills | Status: AC

## 2019-06-08 MED ORDER — ASPIRIN EC 81 MG PO TBEC: 81.0000 mg | DELAYED_RELEASE_TABLET | Freq: Every day | ORAL | 2 refills | Status: DC

## 2019-06-08 MED ORDER — ONDANSETRON 4 MG PO TBDP: 4.0000 mg | ORAL_TABLET | Freq: Four times a day (QID) | ORAL | 0 refills | Status: DC | PRN

## 2019-06-08 NOTE — Progress Notes (Signed)
History:   Victoria Trujillo is a 34 y.o. [id number] at 47w2dby LMP being seen today for her first obstetrical visit.  Her obstetrical history is significant for two term SVDs, last term pregnancy complicated by preeclampsia.  Also had ectopic pregnancy last year.  Patient does intend to breast feed. Pregnancy history fully reviewed.  Patient reports no bleeding and vomiting and wants medication.      HISTORY: OB History  Gravida Para Term Preterm AB Living  '5 2 2 ' 0 2 2  SAB TAB Ectopic Multiple Live Births  0 1 1 0 2    # Outcome Date GA Lbr Len/2nd Weight Sex Delivery Anes PTL Lv  5 Current           4 Ectopic 07/2018          3 Term 06/21/13 334w4d3:04 / 00:13 7 lb 7.2 oz (3.38 kg) F Vag-Spont EPI  LIV     Birth Comments: none     Name: Meinecke,GIRL Parthenia     Apgar1: 8  Apgar5: 9  2 Term 10/09/08 4044w3d lb 13 oz (3.09 kg) F Vag-Spont EPI N LIV     Birth Comments: induced due to BP; Preeclampsia  1 TAB 2007            Past Medical History:  Diagnosis Date  . Headache(784.0)   . Infection    UTI  . Ovarian cyst   . Preeclampsia    Past Surgical History:  Procedure Laterality Date  . LAPAROSCOPIC OVARIAN CYSTECTOMY Right 02/02/2017   Procedure: LAPAROSCOPIC OVARIAN CYSTECTOMY;  Surgeon: PraDonnamae JudeD;  Location: [facility];  Service: Gynecology;  Laterality: Right;  . THERAPEUTIC ABORTION    . WISDOM TOOTH EXTRACTION     Family History  Problem Relation Age of Onset  . Hypertension Mother   . Stroke Mother   . Heart disease Mother   . Diabetes Father   . Hypertension Father   . Heart disease Father   . Stroke Father   . Hypertension Maternal Grandmother   . Cancer Maternal Grandmother   . Kidney disease Maternal Grandfather    Social History   Tobacco Use  . Smoking status: Former Smoker    Types: Cigars    Quit date: 05/21/2009    Years since quitting: 10.0  . Smokeless tobacco: Never Used  . Tobacco comment: not daily  Substance Use Topics  .  Alcohol use: Not Currently    Comment: occasional  . Drug use: No   No Known Allergies Current Outpatient Medications on File Prior to Visit  Medication Sig Dispense Refill  . Prenatal Vit-Fe Fumarate-FA (MULTIVITAMIN-PRENATAL) 27-0.8 MG TABS tablet Take 1 tablet by mouth daily at 12 noon.    . Probiotic Product (PROBIOTIC-10 PO) Take by mouth.    . Ascorbic Acid (VITAMIN C) 1000 MG tablet Take 1,000 mg by mouth daily.     No current facility-administered medications on file prior to visit.    Review of Systems Pertinent items noted in HPI and remainder of comprehensive ROS otherwise negative. Physical Exam:   Vitals:   06/08/19 0908  BP: 117/67  Pulse: 80  Weight: 234 lb (106.1 kg)   Fetal Heart Rate (bpm): 172 Uterus:   12 week size  Pelvic Exam: Perineum: no hemorrhoids, normal perineum   Vulva: normal external genitalia, no lesions   Vagina:  normal mucosa, normal discharge   Cervix: no lesions and normal, pap smear done.  Adnexa: normal adnexa and no mass, fullness, tenderness   Bony Pelvis: average  System: General: well-developed, well-nourished female in no acute distress   Breasts:  normal appearance, no masses or tenderness bilaterally   Skin: normal coloration and turgor, no rashes   Neurologic: oriented, normal, negative, normal mood   Extremities: normal strength, tone, and muscle mass, ROM of all joints is normal   HEENT PERRLA, extraocular movement intact and sclera clear, anicteric   Mouth/Teeth mucous membranes moist, pharynx normal without lesions and dental hygiene good   Neck supple and no masses   Cardiovascular: regular rate and rhythm   Respiratory:  no respiratory distress, normal breath sounds   Abdomen: soft, non-tender; bowel sounds normal; no masses,  no organomegaly       Assessment:    Pregnancy: [id number] Patient Active Problem List   Diagnosis Date Noted  . Supervision of high risk pregnancy, antepartum 05/22/2019  . Obesity in  pregnancy 05/22/2019  . History of preeclampsia, prior pregnancy, currently pregnant 05/22/2019     Plan:    1. History of preeclampsia, prior pregnancy, currently pregnant ASA prescribed - aspirin EC 81 MG tablet; Take 1 tablet (81 mg total) by mouth daily. Take after 12 weeks for prevention of preeclampsia later in pregnancy  Dispense: 300 tablet; Refill: 2  2. Nausea and vomiting of pregnancy, antepartum Antiemetics prescribed - promethazine (PHENERGAN) 25 MG tablet; Take 1 tablet (25 mg total) by mouth every 6 (six) hours as needed for nausea or vomiting.  Dispense: 30 tablet; Refill: 2 - ondansetron (ZOFRAN ODT) 4 MG disintegrating tablet; Take 1 tablet (4 mg total) by mouth every 6 (six) hours as needed for nausea.  Dispense: 20 tablet; Refill: 0  3. Obesity in pregnancy Baseline labs ordered. - Hemoglobin A1c - Protein / creatinine ratio, urine - TSH - Comprehensive metabolic panel  4. Supervision of high risk pregnancy, antepartum - Blood Pressure Monitoring (BLOOD PRESSURE KIT) DEVI; 1 Device by Does not apply route once for 1 dose. icd 10 dx o09.90 high risk pregnany  Dispense: 1 each; Refill: 0 - Genetic Screening - Obstetric Panel, Including HIV - Culture, OB Urine - Cytology - PAP( [place]) - Hemoglobin A1c - Protein / creatinine ratio, urine - TSH - Comprehensive metabolic panel - Hepatitis C Antibody - Prenatal Vit-Fe Phos-FA-Omega (VITAFOL GUMMIES) 3.33-0.333-34.8 MG CHEW; Chew 1 tablet by mouth daily.  Dispense: 90 tablet; Refill: 5 - Cervicovaginal ancillary only( [place])  Initial labs drawn. Continue prenatal vitamins. Genetic Screening discussed, NIPS: ordered. Ultrasound discussed; fetal anatomic survey: already scheduled. Problem list reviewed and updated. The nature of [facility] with multiple MDs and other Advanced Practice Providers was re-reviewed with patient; also emphasized that residents, students are  part of our team. Routine obstetric precautions reviewed. Return in about 4 weeks (around 07/06/2019) for Virtual [facility] Visit.     Verita Schneiders, MD, [place] for [employer], [place]

## 2019-06-08 NOTE — Patient Instructions (Signed)
First Trimester of Pregnancy The first trimester of pregnancy is from week 1 until the end of week 13 (months 1 through 3). A week after a sperm fertilizes an egg, the egg will implant on the wall of the uterus. This embryo will begin to develop into a baby. Genes from you and your partner will form the baby. The female genes will determine whether the baby will be a boy or a girl. At 6-8 weeks, the eyes and face will be formed, and the heartbeat can be seen on ultrasound. At the end of 12 weeks, all the baby's organs will be formed. Now that you are pregnant, you will want to do everything you can to have a healthy baby. Two of the most important things are to get good prenatal care and to follow your health care provider's instructions. Prenatal care is all the medical care you receive before the baby's birth. This care will help prevent, find, and treat any problems during the pregnancy and childbirth. Body changes during your first trimester Your body goes through many changes during pregnancy. The changes vary from woman to woman.  You may gain or lose a couple of pounds at first.  You may feel sick to your stomach (nauseous) and you may throw up (vomit). If the vomiting is uncontrollable, call your health care provider.  You may tire easily.  You may develop headaches that can be relieved by medicines. All medicines should be approved by your health care provider.  You may urinate more often. Painful urination may mean you have a bladder infection.  You may develop heartburn as a result of your pregnancy.  You may develop constipation because certain hormones are causing the muscles that push stool through your intestines to slow down.  You may develop hemorrhoids or swollen veins (varicose veins).  Your breasts may begin to grow larger and become tender. Your nipples may stick out more, and the tissue that surrounds them (areola) may become darker.  Your gums may bleed and may be  sensitive to brushing and flossing.  Dark spots or blotches (chloasma, mask of pregnancy) may develop on your face. This will likely fade after the baby is born.  Your menstrual periods will stop.  You may have a loss of appetite.  You may develop cravings for certain kinds of food.  You may have changes in your emotions from day to day, such as being excited to be pregnant or being concerned that something may go wrong with the pregnancy and baby.  You may have more vivid and strange dreams.  You may have changes in your hair. These can include thickening of your hair, rapid growth, and changes in texture. Some women also have hair loss during or after pregnancy, or hair that feels dry or thin. Your hair will most likely return to normal after your baby is born. What to expect at prenatal visits During a routine prenatal visit:  You will be weighed to make sure you and the baby are growing normally.  Your blood pressure will be taken.  Your abdomen will be measured to track your baby's growth.  The fetal heartbeat will be listened to between weeks 10 and 14 of your pregnancy.  Test results from any previous visits will be discussed. Your health care provider may ask you:  How you are feeling.  If you are feeling the baby move.  If you have had any abnormal symptoms, such as leaking fluid, bleeding, severe headaches, or abdominal   cramping.  If you are using any tobacco products, including cigarettes, chewing tobacco, and electronic cigarettes.  If you have any questions. Other tests that may be performed during your first trimester include:  Blood tests to find your blood type and to check for the presence of any previous infections. The tests will also be used to check for low iron levels (anemia) and protein on red blood cells (Rh antibodies). Depending on your risk factors, or if you previously had diabetes during pregnancy, you may have tests to check for high blood sugar  that affects pregnant women (gestational diabetes).  Urine tests to check for infections, diabetes, or protein in the urine.  An ultrasound to confirm the proper growth and development of the baby.  Fetal screens for spinal cord problems (spina bifida) and Down syndrome.  HIV (human immunodeficiency virus) testing. Routine prenatal testing includes screening for HIV, unless you choose not to have this test.  You may need other tests to make sure you and the baby are doing well. Follow these instructions at home: Medicines  Follow your health care provider's instructions regarding medicine use. Specific medicines may be either safe or unsafe to take during pregnancy.  Take a prenatal vitamin that contains at least 600 micrograms (mcg) of folic acid.  If you develop constipation, try taking a stool softener if your health care provider approves. Eating and drinking   Eat a balanced diet that includes fresh fruits and vegetables, whole grains, good sources of protein such as meat, eggs, or tofu, and low-fat dairy. Your health care provider will help you determine the amount of weight gain that is right for you.  Avoid raw meat and uncooked cheese. These carry germs that can cause birth defects in the baby.  Eating four or five small meals rather than three large meals a day may help relieve nausea and vomiting. If you start to feel nauseous, eating a few soda crackers can be helpful. Drinking liquids between meals, instead of during meals, also seems to help ease nausea and vomiting.  Limit foods that are high in fat and processed sugars, such as fried and sweet foods.  To prevent constipation: ? Eat foods that are high in fiber, such as fresh fruits and vegetables, whole grains, and beans. ? Drink enough fluid to keep your urine clear or pale yellow. Activity  Exercise only as directed by your health care provider. Most women can continue their usual exercise routine during  pregnancy. Try to exercise for 30 minutes at least 5 days a week. Exercising will help you: ? Control your weight. ? Stay in shape. ? Be prepared for labor and delivery.  Experiencing pain or cramping in the lower abdomen or lower back is a good sign that you should stop exercising. Check with your health care provider before continuing with normal exercises.  Try to avoid standing for long periods of time. Move your legs often if you must stand in one place for a long time.  Avoid heavy lifting.  Wear low-heeled shoes and practice good posture.  You may continue to have sex unless your health care provider tells you not to. Relieving pain and discomfort  Wear a good support bra to relieve breast tenderness.  Take warm sitz baths to soothe any pain or discomfort caused by hemorrhoids. Use hemorrhoid cream if your health care provider approves.  Rest with your legs elevated if you have leg cramps or low back pain.  If you develop varicose veins in   your legs, wear support hose. Elevate your feet for 15 minutes, 3-4 times a day. Limit salt in your diet. Prenatal care  Schedule your prenatal visits by the twelfth week of pregnancy. They are usually scheduled monthly at first, then more often in the last 2 months before delivery.  Write down your questions. Take them to your prenatal visits.  Keep all your prenatal visits as told by your health care provider. This is important. Safety  Wear your seat belt at all times when driving.  Make a list of emergency phone numbers, including numbers for family, friends, the hospital, and police and fire departments. General instructions  Ask your health care provider for a referral to a local prenatal education class. Begin classes no later than the beginning of month 6 of your pregnancy.  Ask for help if you have counseling or nutritional needs during pregnancy. Your health care provider can offer advice or refer you to specialists for help  with various needs.  Do not use hot tubs, steam rooms, or saunas.  Do not douche or use tampons or scented sanitary pads.  Do not cross your legs for long periods of time.  Avoid cat litter boxes and soil used by cats. These carry germs that can cause birth defects in the baby and possibly loss of the fetus by miscarriage or stillbirth.  Avoid all smoking, herbs, alcohol, and medicines not prescribed by your health care provider. Chemicals in these products affect the formation and growth of the baby.  Do not use any products that contain nicotine or tobacco, such as cigarettes and e-cigarettes. If you need help quitting, ask your health care provider. You may receive counseling support and other resources to help you quit.  Schedule a dentist appointment. At home, brush your teeth with a soft toothbrush and be gentle when you floss. Contact a health care provider if:  You have dizziness.  You have mild pelvic cramps, pelvic pressure, or nagging pain in the abdominal area.  You have persistent nausea, vomiting, or diarrhea.  You have a bad smelling vaginal discharge.  You have pain when you urinate.  You notice increased swelling in your face, hands, legs, or ankles.  You are exposed to fifth disease or chickenpox.  You are exposed to German measles (rubella) and have never had it. Get help right away if:  You have a fever.  You are leaking fluid from your vagina.  You have spotting or bleeding from your vagina.  You have severe abdominal cramping or pain.  You have rapid weight gain or loss.  You vomit blood or material that looks like coffee grounds.  You develop a severe headache.  You have shortness of breath.  You have any kind of trauma, such as from a fall or a car accident. Summary  The first trimester of pregnancy is from week 1 until the end of week 13 (months 1 through 3).  Your body goes through many changes during pregnancy. The changes vary from  woman to woman.  You will have routine prenatal visits. During those visits, your health care provider will examine you, discuss any test results you may have, and talk with you about how you are feeling. This information is not intended to replace advice given to you by your health care provider. Make sure you discuss any questions you have with your health care provider. Document Revised: 03/26/2017 Document Reviewed: 03/25/2016 Elsevier Patient Education  2020 [employer]vier Inc.  

## 2019-06-09 ENCOUNTER — Encounter: Payer: Self-pay | Admitting: *Deleted

## 2019-06-09 LAB — CERVICOVAGINAL ANCILLARY ONLY
Bacterial Vaginitis (gardnerella): POSITIVE — AB
Candida Glabrata: NEGATIVE
Candida Vaginitis: NEGATIVE
Comment: NEGATIVE
Comment: NEGATIVE
Comment: NEGATIVE
Comment: NEGATIVE
Trichomonas: NEGATIVE

## 2019-06-09 MED ORDER — METRONIDAZOLE 500 MG PO TABS: 500.0000 mg | ORAL_TABLET | Freq: Two times a day (BID) | ORAL | 0 refills | Status: DC

## 2019-06-09 NOTE — Addendum Note (Signed)
Addended by: Verita Schneiders A on: 06/09/2019 04:34 PM   Modules accepted: Orders

## 2019-06-10 LAB — URINE CULTURE, OB REFLEX

## 2019-06-10 LAB — CULTURE, OB URINE

## 2019-06-12 LAB — CYTOLOGY - PAP
Chlamydia: NEGATIVE
Comment: NEGATIVE
Comment: NEGATIVE
Comment: NORMAL
Diagnosis: NEGATIVE
High risk HPV: NEGATIVE
Neisseria Gonorrhea: NEGATIVE

## 2019-06-20 LAB — COMPREHENSIVE METABOLIC PANEL
ALT: 45 IU/L — ABNORMAL HIGH (ref 0–32)
AST: 30 IU/L (ref 0–40)
Albumin/Globulin Ratio: 1.5 (ref 1.2–2.2)
Albumin: 4.4 g/dL (ref 3.8–4.8)
Alkaline Phosphatase: 66 IU/L (ref 39–117)
BUN/Creatinine Ratio: 11 (ref 9–23)
BUN: 7 mg/dL (ref 6–20)
Bilirubin Total: 0.3 mg/dL (ref 0.0–1.2)
CO2: 20 mmol/L (ref 20–29)
Calcium: 9.8 mg/dL (ref 8.7–10.2)
Chloride: 101 mmol/L (ref 96–106)
Creatinine, Ser: 0.65 mg/dL (ref 0.57–1.00)
GFR calc Af Amer: 136 mL/min/{1.73_m2} (ref >59)
GFR calc non Af Amer: 118 mL/min/{1.73_m2} (ref >59)
Globulin, Total: 3 g/dL (ref 1.5–4.5)
Glucose: 91 mg/dL (ref 65–99)
Potassium: 4.2 mmol/L (ref 3.5–5.2)
Sodium: 138 mmol/L (ref 134–144)
Total Protein: 7.4 g/dL (ref 6.0–8.5)

## 2019-06-20 LAB — OBSTETRIC PANEL, INCLUDING HIV
Antibody Screen: NEGATIVE
Basophils Absolute: 0.1 10*3/uL (ref 0.0–0.2)
Basos: 1 %
EOS (ABSOLUTE): 0.1 10*3/uL (ref 0.0–0.4)
Eos: 1 %
HIV Screen 4th Generation wRfx: NONREACTIVE
Hematocrit: 40.2 % (ref 34.0–46.6)
Hemoglobin: 13.3 g/dL (ref 11.1–15.9)
Immature Grans (Abs): 0 10*3/uL (ref 0.0–0.1)
Immature Granulocytes: 0 %
Lymphocytes Absolute: 1.7 10*3/uL (ref 0.7–3.1)
Lymphs: 17 %
MCH: 29.2 pg (ref 26.6–33.0)
MCHC: 33.1 g/dL (ref 31.5–35.7)
MCV: 88 fL (ref 79–97)
Monocytes Absolute: 0.5 10*3/uL (ref 0.1–0.9)
Monocytes: 6 %
Neutrophils Absolute: 7.3 10*3/uL — ABNORMAL HIGH (ref 1.4–7.0)
Neutrophils: 75 %
Platelets: 351 10*3/uL (ref 150–450)
RBC: 4.56 x10E6/uL (ref 3.77–5.28)
RDW: 13.1 % (ref 11.7–15.4)
RPR Ser Ql: NONREACTIVE
Rh Factor: POSITIVE
Rubella Antibodies, IGG: 1.14 index (ref >0.99)
WBC: 9.6 10*3/uL (ref 3.4–10.8)

## 2019-06-20 LAB — HEMOGLOBIN A1C
Est. average glucose Bld gHb Est-mCnc: 100 mg/dL
Hgb A1c MFr Bld: 5.1 % (ref 4.8–5.6)

## 2019-06-20 LAB — HEPATITIS C ANTIBODY: Hep C Virus Ab: <0.1 s/co ratio (ref 0.0–0.9)

## 2019-06-20 LAB — TSH: TSH: 2.24 u[IU]/mL (ref 0.450–4.500)

## 2019-06-20 NOTE — Addendum Note (Signed)
Addended by: Verita Schneiders A on: 06/20/2019 02:41 PM   Modules accepted: Orders

## 2019-07-03 ENCOUNTER — Encounter: Payer: Self-pay | Admitting: *Deleted

## 2019-07-03 NOTE — Addendum Note (Signed)
Addended by: Verita Schneiders A on: 07/03/2019 12:34 PM   Modules accepted: Orders

## 2019-07-03 NOTE — Progress Notes (Signed)
Needs Panorama redrawn due to insufficient fetal cells  Please also check HbSAg (not done with OB panel due to technical issues) at same draw and AFP Tetra too (orders placed).  Can come for lab appt on 07/04/19 or later ([redacted]w[redacted]d or later given AFP draw).  Of note, AFP Tetra/Quad Screen ordered instead of AFP only in case the fetal cells are insufficient again. Please call to inform patient of results and recommendations.   Verita Schneiders, MD

## 2019-07-04 ENCOUNTER — Telehealth (INDEPENDENT_AMBULATORY_CARE_PROVIDER_SITE_OTHER): Payer: Medicaid Other | Admitting: General Practice

## 2019-07-04 DIAGNOSIS — O0990 Supervision of high risk pregnancy, unspecified, unspecified trimester: Secondary | ICD-10-CM

## 2019-07-04 NOTE — Telephone Encounter (Signed)
-----   Message from Osborne Oman, MD sent at 07/03/2019 12:34 PM EST ----- Needs Panorama redrawn due to insufficient fetal cells  Please also check HbSAg (not done with OB panel due to technical issues) at same draw and AFP Tetra too (orders placed).  Can come for lab appt on 07/04/19 or later ([redacted]w[redacted]d or later given AFP draw).  Of note, AFP Tetra/Quad Screen ordered instead of AFP only in case the fetal cells are insufficient again. Please call to inform patient of results and recommendations.   Verita Schneiders, MD

## 2019-07-04 NOTE — Telephone Encounter (Addendum)
VM left stating I am returning pt's phone call and she may call the office when available. Callback number given.  Pt called and left VM on nurse line. Called pt back at [phone]. Explained results showing insufficient fetal cells and need for more lab work. Explained this can happen when genetic screening labs are drawn early in pregnancy which is why Dr. Harolyn Rutherford wanted the pt to wait until 15 weeks to have this redrawn. Call transferred to front office so that lab appt may be scheduled.

## 2019-07-04 NOTE — Telephone Encounter (Signed)
Called patient, no answer- left message to call back to discuss results.

## 2019-07-04 NOTE — Telephone Encounter (Signed)
Patient called and left message stating she is returning our call. 

## 2019-07-05 ENCOUNTER — Other Ambulatory Visit: Payer: Medicaid Other

## 2019-07-05 ENCOUNTER — Other Ambulatory Visit: Payer: Self-pay

## 2019-07-05 DIAGNOSIS — O0990 Supervision of high risk pregnancy, unspecified, unspecified trimester: Secondary | ICD-10-CM

## 2019-07-06 ENCOUNTER — Telehealth (INDEPENDENT_AMBULATORY_CARE_PROVIDER_SITE_OTHER): Payer: Medicaid Other | Admitting: Obstetrics and Gynecology

## 2019-07-06 DIAGNOSIS — O0990 Supervision of high risk pregnancy, unspecified, unspecified trimester: Secondary | ICD-10-CM

## 2019-07-06 DIAGNOSIS — O99212 Obesity complicating pregnancy, second trimester: Secondary | ICD-10-CM

## 2019-07-06 DIAGNOSIS — O09299 Supervision of pregnancy with other poor reproductive or obstetric history, unspecified trimester: Secondary | ICD-10-CM

## 2019-07-06 DIAGNOSIS — Z3A15 15 weeks gestation of pregnancy: Secondary | ICD-10-CM

## 2019-07-06 DIAGNOSIS — E669 Obesity, unspecified: Secondary | ICD-10-CM

## 2019-07-06 DIAGNOSIS — O0992 Supervision of high risk pregnancy, unspecified, second trimester: Secondary | ICD-10-CM

## 2019-07-06 DIAGNOSIS — O09292 Supervision of pregnancy with other poor reproductive or obstetric history, second trimester: Secondary | ICD-10-CM

## 2019-07-06 DIAGNOSIS — O99210 Obesity complicating pregnancy, unspecified trimester: Secondary | ICD-10-CM

## 2019-07-06 NOTE — Progress Notes (Signed)
I connected with  Victoria Trujillo on [phone] at  8:55 AM EST by telephone and verified that I am speaking with the correct person using two identifiers.   I discussed the limitations, risks, security and privacy concerns of performing an evaluation and management service by telephone and the availability of in person appointments. I also discussed with the patient that there may be a patient responsible charge related to this service. The patient expressed understanding and agreed to proceed.  [name], RN 07/06/2019  9:03 AM

## 2019-07-06 NOTE — Progress Notes (Signed)
   TELEHEALTH VIRTUAL OBSTETRICS VISIT ENCOUNTER NOTE  Clinic: [facility]  I connected with Juluis Pitch on [id number] at  8:55 AM EST by telephone at home and verified that I am speaking with the correct person using two identifiers.   I discussed the limitations, risks, security and privacy concerns of performing an evaluation and management service by telephone and the availability of in person appointments. I also discussed with the patient that there may be a patient responsible charge related to this service. The patient expressed understanding and agreed to proceed.  Subjective:  Victoria Trujillo is a 34 y.o. [id number] at [redacted]w[redacted]d being followed for ongoing prenatal care.  She is currently monitored for the following issues for this low-risk pregnancy and has Supervision of high risk pregnancy, antepartum; Obesity in pregnancy; and History of preeclampsia, prior pregnancy, currently pregnant on their problem list.  Patient reports no complaints. Reports fetal movement. Denies any contractions, bleeding or leaking of fluid.   The following portions of the patient's history were reviewed and updated as appropriate: allergies, current medications, past family history, past medical history, past social history, past surgical history and problem list.   Objective:   Vitals:   07/06/19 0922  BP: 110/72    Babyscripts Data Reviewed: yes  General:  Alert, oriented and cooperative.   Mental Status: Normal mood and affect perceived. Normal judgment and thought content.  Rest of physical exam deferred due to type of encounter  Assessment and Plan:  Pregnancy: [id number] at [redacted]w[redacted]d 1. Supervision of high risk pregnancy, antepartum Routine care afp tetra, hepb drawn yesterday Anatomy u/s already scheduled  2. Obesity in pregnancy  3. Hx of preeclampsia, prior pregnancy, currently pregnant Low dose asa reviewed with her. Pt to fill  Preterm labor symptoms and general  obstetric precautions including but not limited to vaginal bleeding, contractions, leaking of fluid and fetal movement were reviewed in detail with the patient.  I discussed the assessment and treatment plan with the patient. The patient was provided an opportunity to ask questions and all were answered. The patient agreed with the plan and demonstrated an understanding of the instructions. The patient was advised to call back or seek an in-person office evaluation/go to MAU at [facility] for any urgent or concerning symptoms. Please refer to After Visit Summary for other counseling recommendations.   I provided 7 minutes of non-face-to-face time during this encounter. The visit was conducted via MyChart-medicine  Return in about 26 days (around 08/01/2019) for low risk, in person.  Future Appointments  Date Time Provider [place]  08/01/2019  8:00 AM [place] MFC-US  08/01/2019  8:00 AM WH-MFC [place] 3 WH-MFCUS MFC-US    Connie Hilgert, MD [facility], [place]

## 2019-07-07 LAB — AFP TETRA
DIA Mom Value: 0.54
DIA Value (EIA): 73.5 pg/mL
DSR (By Age)    1 IN: 468
DSR (Second Trimester) 1 IN: 3615
Gestational Age: 15.1 WEEKS
MSAFP Mom: 1.11
MSAFP: 27.1 ng/mL
MSHCG Mom: 0.65
MSHCG: 26796 m[IU]/mL
Maternal Age At EDD: 32.7 yr
Osb Risk: 10000
T18 (By Age): 1:1824 {titer}
Test Results:: NEGATIVE
Weight: 238 [lb_av]
uE3 Mom: 0.67
uE3 Value: 0.47 ng/mL

## 2019-07-07 LAB — HEPATITIS B SURFACE ANTIGEN: Hepatitis B Surface Ag: NEGATIVE

## 2019-07-18 ENCOUNTER — Encounter: Payer: Self-pay | Admitting: General Practice

## 2019-08-01 ENCOUNTER — Encounter: Payer: Self-pay | Admitting: Advanced Practice Midwife

## 2019-08-01 ENCOUNTER — Ambulatory Visit (HOSPITAL_COMMUNITY)
Admission: RE | Admit: 2019-08-01 | Discharge: 2019-08-01 | Disposition: A | Discharge status: Home / Self Care | Payer: Medicaid Other | Source: Ambulatory Visit | Attending: Obstetrics and Gynecology | Admitting: Obstetrics and Gynecology

## 2019-08-01 ENCOUNTER — Other Ambulatory Visit: Payer: Self-pay

## 2019-08-01 ENCOUNTER — Encounter (HOSPITAL_COMMUNITY): Payer: Self-pay

## 2019-08-01 ENCOUNTER — Ambulatory Visit (HOSPITAL_COMMUNITY): Payer: Medicaid Other | Admitting: *Deleted

## 2019-08-01 ENCOUNTER — Other Ambulatory Visit (HOSPITAL_COMMUNITY): Payer: Self-pay | Admitting: *Deleted

## 2019-08-01 ENCOUNTER — Ambulatory Visit (INDEPENDENT_AMBULATORY_CARE_PROVIDER_SITE_OTHER): Payer: Medicaid Other | Admitting: Advanced Practice Midwife

## 2019-08-01 DIAGNOSIS — Z363 Encounter for antenatal screening for malformations: Secondary | ICD-10-CM

## 2019-08-01 DIAGNOSIS — O0990 Supervision of high risk pregnancy, unspecified, unspecified trimester: Secondary | ICD-10-CM

## 2019-08-01 DIAGNOSIS — Z3A19 19 weeks gestation of pregnancy: Secondary | ICD-10-CM

## 2019-08-01 DIAGNOSIS — O09299 Supervision of pregnancy with other poor reproductive or obstetric history, unspecified trimester: Secondary | ICD-10-CM

## 2019-08-01 DIAGNOSIS — O99210 Obesity complicating pregnancy, unspecified trimester: Secondary | ICD-10-CM

## 2019-08-01 DIAGNOSIS — E669 Obesity, unspecified: Secondary | ICD-10-CM

## 2019-08-01 DIAGNOSIS — O99212 Obesity complicating pregnancy, second trimester: Secondary | ICD-10-CM | POA: Diagnosis not present

## 2019-08-01 DIAGNOSIS — Z362 Encounter for other antenatal screening follow-up: Secondary | ICD-10-CM

## 2019-08-01 NOTE — Progress Notes (Signed)
   PRENATAL VISIT NOTE  Subjective:  Victoria Trujillo is a 34 y.o. [id number] at [redacted]w[redacted]d being seen today for ongoing prenatal care.  She is currently monitored for the following issues for this low-risk pregnancy and has Supervision of high risk pregnancy, antepartum; Obesity in pregnancy; and History of preeclampsia, prior pregnancy, currently pregnant on their problem list.  Patient reports no complaints.  Contractions: Not present. Vag. Bleeding: None.  Movement: Present. Denies leaking of fluid.   The following portions of the patient's history were reviewed and updated as appropriate: allergies, current medications, past family history, past medical history, past social history, past surgical history and problem list.   Objective:   Vitals:   08/01/19 1022  BP: 112/61  Pulse: 74  Weight: 240 lb 8 oz (109.1 kg)    Fetal Status: Fetal Heart Rate (bpm): 164   Movement: Present     General:  Alert, oriented and cooperative. Patient is in no acute distress.  Skin: Skin is warm and dry. No rash noted.   Cardiovascular: Normal heart rate noted  Respiratory: Normal respiratory effort, no problems with respiration noted  Abdomen: Soft, gravid, appropriate for gestational age.  Pain/Pressure: Absent     Pelvic: Cervical exam deferred        Extremities: Normal range of motion.  Edema: None  Mental Status: Normal mood and affect. Normal behavior. Normal judgment and thought content.   Assessment and Plan:  Pregnancy: [id number] at [redacted]w[redacted]d 1. Supervision of high risk pregnancy, antepartum - routine care  Preterm labor symptoms and general obstetric precautions including but not limited to vaginal bleeding, contractions, leaking of fluid and fetal movement were reviewed in detail with the patient. Please refer to After Visit Summary for other counseling recommendations.   Return in about 4 weeks (around 08/29/2019) for virtual visit .  Future Appointments  Date Time Provider [place]    08/29/2019  9:30 AM WH-MFC [place] 1 WH-MFCUS MFC-US    Danna Casella DNP, CNM  08/01/19  10:29 AM

## 2019-08-29 ENCOUNTER — Ambulatory Visit (HOSPITAL_COMMUNITY): Payer: Medicaid Other | Attending: Obstetrics and Gynecology

## 2019-08-29 ENCOUNTER — Other Ambulatory Visit: Payer: Self-pay | Admitting: *Deleted

## 2019-08-29 ENCOUNTER — Ambulatory Visit: Payer: Medicaid Other | Admitting: *Deleted

## 2019-08-29 ENCOUNTER — Other Ambulatory Visit: Payer: Self-pay

## 2019-08-29 ENCOUNTER — Telehealth (INDEPENDENT_AMBULATORY_CARE_PROVIDER_SITE_OTHER): Payer: Medicaid Other | Admitting: Student

## 2019-08-29 DIAGNOSIS — O09292 Supervision of pregnancy with other poor reproductive or obstetric history, second trimester: Secondary | ICD-10-CM

## 2019-08-29 DIAGNOSIS — O09299 Supervision of pregnancy with other poor reproductive or obstetric history, unspecified trimester: Secondary | ICD-10-CM

## 2019-08-29 DIAGNOSIS — O99210 Obesity complicating pregnancy, unspecified trimester: Secondary | ICD-10-CM

## 2019-08-29 DIAGNOSIS — O99212 Obesity complicating pregnancy, second trimester: Secondary | ICD-10-CM | POA: Diagnosis not present

## 2019-08-29 DIAGNOSIS — O0990 Supervision of high risk pregnancy, unspecified, unspecified trimester: Secondary | ICD-10-CM | POA: Diagnosis present

## 2019-08-29 DIAGNOSIS — Z3A23 23 weeks gestation of pregnancy: Secondary | ICD-10-CM

## 2019-08-29 DIAGNOSIS — E669 Obesity, unspecified: Secondary | ICD-10-CM

## 2019-08-29 DIAGNOSIS — O321XX0 Maternal care for breech presentation, not applicable or unspecified: Secondary | ICD-10-CM

## 2019-08-29 DIAGNOSIS — Z362 Encounter for other antenatal screening follow-up: Secondary | ICD-10-CM | POA: Insufficient documentation

## 2019-08-29 NOTE — Progress Notes (Signed)
10:48a- Called pt for virtual visit, no answer, left VM that will call back in 10 to 15 minutes.   I connected with  Victoria Trujillo on [phone] at 10:55 AM EDT by telephone and verified that I am speaking with the correct person using two identifiers.   I discussed the limitations, risks, security and privacy concerns of performing an evaluation and management service by telephone and the availability of in person appointments. I also discussed with the patient that there may be a patient responsible charge related to this service. The patient expressed understanding and agreed to proceed.  Bethanne Ginger, [place] 08/29/2019  10:56 AM

## 2019-08-29 NOTE — Progress Notes (Signed)
Patient ID: Victoria Trujillo, female   DOB: December 08, 1985, 34 y.o.   MRN: [id number]   PRENATAL VISIT NOTE  Subjective:  Victoria Trujillo is a 34 y.o. [id number] at [redacted]w[redacted]d being seen today for ongoing prenatal care.  She is currently monitored for the following issues for this low-risk pregnancy and has Supervision of high risk pregnancy, antepartum; Obesity in pregnancy; and History of preeclampsia, prior pregnancy, currently pregnant on their problem list.  Patient reports no complaints. She reports that her blood pressure has been normal; 110s-120s/60. She is not on any medication.    Contractions: Not present. Vag. Bleeding: None.  Movement: Present. Denies leaking of fluid.   The following portions of the patient's history were reviewed and updated as appropriate: allergies, current medications, past family history, past medical history, past social history, past surgical history and problem list.   Objective:   Vitals:   08/29/19 1059  BP: 108/63  Pulse: 73    Fetal Status:     Movement: Present     General:  Alert, oriented and cooperative. Patient is in no acute distress.  Skin: Skin is warm and dry. No rash noted.   Cardiovascular: Normal heart rate noted  Respiratory: Normal respiratory effort, no problems with respiration noted  Abdomen: Soft, gravid, appropriate for gestational age.  Pain/Pressure: Absent     Pelvic: Cervical exam deferred        Extremities: Normal range of motion.  Edema: None  Mental Status: Normal mood and affect. Normal behavior. Normal judgment and thought content.   Assessment and Plan:  Pregnancy: [id number] at [redacted]w[redacted]d 1. Supervision of high risk pregnancy, antepartum -draw baseline pre-e labs at next visit (CMP and protein creatinine)  -guidance given on 2 hour GTT; patient knows that she needs to be fasting.  -FUP [place] today was normal -she will sign consent at next visit 2. Obesity in pregnancy   3. Hx of preeclampsia, prior pregnancy, currently  pregnant Continue taking aspirin; continue to check BP once a week.   Reviewed warning blood pressure values (systolic = / > [id number] and/or diastolic =/> 90). Explained that, if blood pressure is elevated, she should sit down, rest, and eat/drink something. If still elevated 15 minutes later, and she is greater than 20 weeks, she should call clinic or come to MAU. She should come to MAU if she has elevated pressures and any of the following:  - headache not relieved with tylenol, rest, hydration -blurry vision, floating spots in her vision - sudden full-body edema or facial edema -RUQ pain that is constant.  These symptoms may indicate that her blood pressure is worsening and she may be developing gestational hypertension or pre-eclampsia, which is an emergency.    Preterm labor symptoms and general obstetric precautions including but not limited to vaginal bleeding, contractions, leaking of fluid and fetal movement were reviewed in detail with the patient. Please refer to After Visit Summary for other counseling recommendations.   No follow-ups on file.  Future Appointments  Date Time Provider [place]  09/26/2019  8:50 AM [facility] LAB [facility]  09/26/2019  9:15 AM Laury Deep, CNM [facility]  10/31/2019  9:15 AM WMC-MFC US2 W[facility]-MFCUS [place], CNM

## 2019-09-26 ENCOUNTER — Other Ambulatory Visit: Payer: Self-pay | Admitting: *Deleted

## 2019-09-26 ENCOUNTER — Encounter: Payer: Self-pay | Admitting: Obstetrics and Gynecology

## 2019-09-26 ENCOUNTER — Ambulatory Visit (INDEPENDENT_AMBULATORY_CARE_PROVIDER_SITE_OTHER): Payer: Medicaid Other | Admitting: Obstetrics and Gynecology

## 2019-09-26 ENCOUNTER — Other Ambulatory Visit: Payer: Medicaid Other

## 2019-09-26 ENCOUNTER — Other Ambulatory Visit: Payer: Self-pay

## 2019-09-26 ENCOUNTER — Encounter: Payer: Self-pay | Admitting: *Deleted

## 2019-09-26 VITALS — BP 109/73 | HR 80 | Wt 244.0 lb

## 2019-09-26 DIAGNOSIS — O09299 Supervision of pregnancy with other poor reproductive or obstetric history, unspecified trimester: Secondary | ICD-10-CM

## 2019-09-26 DIAGNOSIS — Z23 Encounter for immunization: Secondary | ICD-10-CM | POA: Diagnosis not present

## 2019-09-26 DIAGNOSIS — O26892 Other specified pregnancy related conditions, second trimester: Secondary | ICD-10-CM

## 2019-09-26 DIAGNOSIS — Z3A27 27 weeks gestation of pregnancy: Secondary | ICD-10-CM

## 2019-09-26 DIAGNOSIS — O0992 Supervision of high risk pregnancy, unspecified, second trimester: Secondary | ICD-10-CM

## 2019-09-26 DIAGNOSIS — O0990 Supervision of high risk pregnancy, unspecified, unspecified trimester: Secondary | ICD-10-CM

## 2019-09-26 DIAGNOSIS — N949 Unspecified condition associated with female genital organs and menstrual cycle: Secondary | ICD-10-CM

## 2019-09-26 MED ORDER — COMFORT FIT MATERNITY SUPP LG MISC: 1.0000 [IU] | Freq: Every day | 0 refills | Status: DC

## 2019-09-26 NOTE — Progress Notes (Signed)
   LOW-RISK PREGNANCY OFFICE VISIT Patient name: Victoria Trujillo MRN [id number]  Date of birth: 1985-11-02 Chief Complaint:   Routine Prenatal Visit  History of Present Illness:   Victoria Trujillo is a 34 y.o. [id number] female at 78w0dwith an Estimated Date of Delivery: 12/26/19 being seen today for ongoing management of a low-risk pregnancy.  Today she reports lower pelvic pressure that causes pink tinged d/c with wiping. She doesn't think it's bleeding, because it only happens occ, not associated with SI or UC's. She only notices it when she is "up doing too much and then settles down for the night". Contractions: Not present. Vag. Bleeding: None.  Movement: Present. denies leaking of fluid. Review of Systems:   Pertinent items are noted in HPI Denies abnormal vaginal discharge w/ itching/odor/irritation, headaches, visual changes, shortness of breath, chest pain, abdominal pain, severe nausea/vomiting, or problems with urination or bowel movements unless otherwise stated above. Pertinent History Reviewed:  Reviewed past medical,surgical, social, obstetrical and family history.  Reviewed problem list, medications and allergies. Physical Assessment:   Vitals:   09/26/19 0947  BP: 109/73  Pulse: 80  Weight: 244 lb (110.7 kg)  Body mass index is 40.6 kg/m.        Physical Examination:   General appearance: Well appearing, and in no distress  Mental status: Alert, oriented to person, place, and time  Skin: Warm & dry  Cardiovascular: Normal heart rate noted  Respiratory: Normal respiratory effort, no distress  Abdomen: Soft, gravid, nontender  Pelvic: Cervical exam deferred         Extremities: Edema: None  Fetal Status: Fetal Heart Rate (bpm): 152   Movement: Present    No results found for this or any previous visit (from the past 24 hour(s)).  Assessment & Plan:  1) High-risk pregnancy [id number] 270w0dith an Estimated Date of Delivery: 12/26/19   2) Supervision of high risk  pregnancy, antepartum - Discussed fasting parameters with GTT - Return this week or next for 2 hr GTT  3) Hx of preeclampsia, prior pregnancy, currently pregnant - Aware of PEC s/sx's to look for  - Taking bASA daily   Meds:  Meds ordered this encounter  Medications  . Elastic Bandages & Supports (COMFORT FIT MATERNITY SUPP LG) MISC    Sig: 1 Units by Does not apply route daily.    Dispense:  1 each    Refill:  0    Order Specific Question:   Supervising Provider    Answer:   PRDonnamae Jude2[[id number]] Labs/procedures today: BTL consent done today  Plan:  Continue routine obstetrical care   Reviewed: Preterm labor symptoms and general obstetric precautions including but not limited to vaginal bleeding, contractions, leaking of fluid and fetal movement were reviewed in detail with the patient.  All questions were answered. Has home bp cuff. Check bp weekly, let us[place]ow if >140/90.   Follow-up: Return in about 2 days (around 09/28/2019) for 2 hr GTT.  Orders Placed This Encounter  Procedures  . Tdap vaccine greater than or equal to 7yo IM  . Comp Met (CMET)  . Protein / creatinine ratio, urine   RoLaury DeepSN, CNM 09/26/2019

## 2019-09-26 NOTE — Patient Instructions (Addendum)
PREGNANCY SUPPORT BELT: You are not alone, Seventy-five percent of women have some sort of abdominal or back pain at some point in their pregnancy. Your baby is growing at a fast pace, which means that your whole body is rapidly trying to adjust to the changes. As your uterus grows, your back may start feeling a bit under stress and this can result in back or abdominal pain that can go from mild, and therefore bearable, to severe pains that will not allow you to sit or lay down comfortably, When it comes to dealing with pregnancy-related pains and cramps, some pregnant women usually prefer natural remedies, which the market is filled with nowadays. For example, wearing a pregnancy support belt can help ease and lessen your discomfort and pain. WHAT ARE THE BENEFITS OF WEARING A PREGNANCY SUPPORT BELT? A pregnancy support belt provides support to the lower portion of the belly taking some of the weight of the growing uterus and distributing to the other parts of your body. It is designed make you comfortable and gives you extra support. Over the years, the pregnancy apparel market has been studying the needs and wants of pregnant women and they have come up with the most comfortable pregnancy support belts that woman could ever ask for. In fact, you will no longer have to wear a stretched-out or bulky pregnancy belt that is visible underneath your clothes and makes you feel even more uncomfortable. Nowadays, a pregnancy support belt is made of comfortable and stretchy materials that will not irritate your skin but will actually make you feel at ease and you will not even notice you are wearing it. They are easy to put on and adjust during the day and can be worn at night for additional support.  BENEFITS: . Relives Back pain . Relieves Abdominal Muscle and Leg Pain . Stabilizes the Pelvic Ring . Offers a Cushioned Abdominal Lift Pad . Relieves pressure on the Sciatic Nerve Within Minutes WHERE TO GET  YOUR PREGNANCY BELT:  Child psychotherapist and Orthotics  [address], [place] [address]  [phone]  [email or web address]   Glucose Tolerance Test During Pregnancy Why am I having this test? The glucose tolerance test (GTT) is done to check how your body processes sugar (glucose). This is one of several tests used to diagnose diabetes that develops during pregnancy (gestational diabetes mellitus). Gestational diabetes is a temporary form of diabetes that some women develop during pregnancy. It usually occurs during the second trimester of pregnancy and goes away after delivery. Testing (screening) for gestational diabetes usually occurs between 24 and 28 weeks of pregnancy. You may have the GTT test after having a 1-hour glucose screening test if the results from that test indicate that you may have gestational diabetes. You may also have this test if:  You have a history of gestational diabetes.  You have a history of giving birth to very large babies or have experienced repeated fetal loss (stillbirth).  You have signs and symptoms of diabetes, such as: ? Changes in your vision. ? Tingling or numbness in your hands or feet. ? Changes in hunger, thirst, and urination that are not otherwise explained by your pregnancy. What is being tested? This test measures the amount of glucose in your blood at different times during a period of 3 hours. This indicates how well your body is able to process glucose. What kind of sample is taken?  Blood samples are required for this test. They are usually collected  by inserting a needle into a blood vessel. How do I prepare for this test?  For 3 days before your test, eat normally. Have plenty of carbohydrate-rich foods.  Follow instructions from your health care provider about: ? Eating or drinking restrictions on the day of the test. You may be asked to not eat or drink anything other than water (fast) starting 8-10 hours before the  test. ? Changing or stopping your regular medicines. Some medicines may interfere with this test. Tell a health care provider about:  All medicines you are taking, including vitamins, herbs, eye drops, creams, and over-the-counter medicines.  Any blood disorders you have.  Any surgeries you have had.  Any medical conditions you have. What happens during the test? First, your blood glucose will be measured. This is referred to as your fasting blood glucose, since you fasted before the test. Then, you will drink a glucose solution that contains a certain amount of glucose. Your blood glucose will be measured again 1, 2, and 3 hours after drinking the solution. This test takes about 3 hours to complete. You will need to stay at the testing location during this time. During the testing period:  Do not eat or drink anything other than the glucose solution.  Do not exercise.  Do not use any products that contain nicotine or tobacco, such as cigarettes and e-cigarettes. If you need help stopping, ask your health care provider. The testing procedure may vary among health care providers and hospitals. How are the results reported? Your results will be reported as milligrams of glucose per deciliter of blood (mg/dL) or millimoles per liter (mmol/L). Your health care provider will compare your results to normal ranges that were established after testing a large group of people (reference ranges). Reference ranges may vary among labs and hospitals. For this test, common reference ranges are:  Fasting: less than 95-105 mg/dL (5.3-5.8 mmol/L).  1 hour after drinking glucose: less than 180-190 mg/dL (10.0-10.5 mmol/L).  2 hours after drinking glucose: less than 155-165 mg/dL (8.6-9.2 mmol/L).  3 hours after drinking glucose: 140-145 mg/dL (7.8-8.1 mmol/L). What do the results mean? Results within reference ranges are considered normal, meaning that your glucose levels are well-controlled. If two or  more of your blood glucose levels are high, you may be diagnosed with gestational diabetes. If only one level is high, your health care provider may suggest repeat testing or other tests to confirm a diagnosis. Talk with your health care provider about what your results mean. Questions to ask your health care provider Ask your health care provider, or the department that is doing the test:  When will my results be ready?  How will I get my results?  What are my treatment options?  What other tests do I need?  What are my next steps? Summary  The glucose tolerance test (GTT) is one of several tests used to diagnose diabetes that develops during pregnancy (gestational diabetes mellitus). Gestational diabetes is a temporary form of diabetes that some women develop during pregnancy.  You may have the GTT test after having a 1-hour glucose screening test if the results from that test indicate that you may have gestational diabetes. You may also have this test if you have any symptoms or risk factors for gestational diabetes.  Talk with your health care provider about what your results mean. This information is not intended to replace advice given to you by your health care provider. Make sure you discuss any questions you  have with your health care provider. Document Revised: 08/04/2018 Document Reviewed: 11/23/2016 Elsevier Patient Education  [place].

## 2019-09-29 ENCOUNTER — Other Ambulatory Visit: Payer: Self-pay

## 2019-09-29 ENCOUNTER — Other Ambulatory Visit: Payer: Medicaid Other

## 2019-09-29 DIAGNOSIS — O0990 Supervision of high risk pregnancy, unspecified, unspecified trimester: Secondary | ICD-10-CM

## 2019-09-30 LAB — PROTEIN / CREATININE RATIO, URINE
Creatinine, Urine: 80.8 mg/dL
Protein, Ur: 11.8 mg/dL
Protein/Creat Ratio: 146 mg/g creat (ref 0–200)

## 2019-09-30 LAB — COMPREHENSIVE METABOLIC PANEL
ALT: 30 IU/L (ref 0–32)
AST: 22 IU/L (ref 0–40)
Albumin/Globulin Ratio: 1.4 (ref 1.2–2.2)
Albumin: 3.8 g/dL (ref 3.8–4.8)
Alkaline Phosphatase: 87 IU/L (ref 48–121)
BUN/Creatinine Ratio: 11 (ref 9–23)
BUN: 7 mg/dL (ref 6–20)
Bilirubin Total: 0.2 mg/dL (ref 0.0–1.2)
CO2: 20 mmol/L (ref 20–29)
Calcium: 9.2 mg/dL (ref 8.7–10.2)
Chloride: 104 mmol/L (ref 96–106)
Creatinine, Ser: 0.62 mg/dL (ref 0.57–1.00)
GFR calc Af Amer: 138 mL/min/{1.73_m2} (ref >59)
GFR calc non Af Amer: 120 mL/min/{1.73_m2} (ref >59)
Globulin, Total: 2.7 g/dL (ref 1.5–4.5)
Glucose: 73 mg/dL (ref 65–99)
Potassium: 4.2 mmol/L (ref 3.5–5.2)
Sodium: 138 mmol/L (ref 134–144)
Total Protein: 6.5 g/dL (ref 6.0–8.5)

## 2019-09-30 LAB — RPR: RPR Ser Ql: NONREACTIVE

## 2019-09-30 LAB — CBC
Hematocrit: 34.8 % (ref 34.0–46.6)
Hemoglobin: 11.4 g/dL (ref 11.1–15.9)
MCH: 29.3 pg (ref 26.6–33.0)
MCHC: 32.8 g/dL (ref 31.5–35.7)
MCV: 90 fL (ref 79–97)
Platelets: 299 10*3/uL (ref 150–450)
RBC: 3.89 x10E6/uL (ref 3.77–5.28)
RDW: 13.7 % (ref 11.7–15.4)
WBC: 8.6 10*3/uL (ref 3.4–10.8)

## 2019-09-30 LAB — GLUCOSE TOLERANCE, 2 HOURS W/ 1HR
Glucose, 1 hour: 121 mg/dL (ref 65–179)
Glucose, 2 hour: 87 mg/dL (ref 65–152)
Glucose, Fasting: 77 mg/dL (ref 65–91)

## 2019-09-30 LAB — HIV ANTIBODY (ROUTINE TESTING W REFLEX): HIV Screen 4th Generation wRfx: NONREACTIVE

## 2019-10-24 ENCOUNTER — Other Ambulatory Visit: Payer: Self-pay

## 2019-10-24 ENCOUNTER — Ambulatory Visit (INDEPENDENT_AMBULATORY_CARE_PROVIDER_SITE_OTHER): Payer: Medicaid Other | Admitting: Student

## 2019-10-24 DIAGNOSIS — O99213 Obesity complicating pregnancy, third trimester: Secondary | ICD-10-CM

## 2019-10-24 DIAGNOSIS — O0990 Supervision of high risk pregnancy, unspecified, unspecified trimester: Secondary | ICD-10-CM

## 2019-10-24 DIAGNOSIS — Z3A31 31 weeks gestation of pregnancy: Secondary | ICD-10-CM

## 2019-10-24 DIAGNOSIS — E669 Obesity, unspecified: Secondary | ICD-10-CM

## 2019-10-24 DIAGNOSIS — Z8759 Personal history of other complications of pregnancy, childbirth and the puerperium: Secondary | ICD-10-CM

## 2019-10-24 DIAGNOSIS — O0993 Supervision of high risk pregnancy, unspecified, third trimester: Secondary | ICD-10-CM

## 2019-10-24 NOTE — Progress Notes (Signed)
   PRENATAL VISIT NOTE  Subjective:  Victoria Trujillo is a 34 y.o. [id number] at [redacted]w[redacted]d being seen today for ongoing prenatal care.  She is currently monitored for the following issues for this low-risk pregnancy and has Supervision of high risk pregnancy, antepartum; Obesity in pregnancy; and History of preeclampsia, prior pregnancy, currently pregnant on their problem list.  Patient reports no complaints. She is checking her BP at home.  Contractions: Not present. Vag. Bleeding: None.  Movement: Present. Denies leaking of fluid.   The following portions of the patient's history were reviewed and updated as appropriate: allergies, current medications, past family history, past medical history, past social history, past surgical history and problem list.   Objective:   Vitals:   10/24/19 0954  BP: 112/65  Pulse: 80  Weight: 246 lb 11.2 oz (111.9 kg)    Fetal Status: Fetal Heart Rate (bpm): 140 Fundal Height: 33 cm Movement: Present     General:  Alert, oriented and cooperative. Patient is in no acute distress.  Skin: Skin is warm and dry. No rash noted.   Cardiovascular: Normal heart rate noted  Respiratory: Normal respiratory effort, no problems with respiration noted  Abdomen: Soft, gravid, appropriate for gestational age.  Pain/Pressure: Absent     Pelvic: Cervical exam deferred        Extremities: Normal range of motion.  Edema: None  Mental Status: Normal mood and affect. Normal behavior. Normal judgment and thought content.   Assessment and Plan:  Pregnancy: [id number] at [redacted]w[redacted]d 1. Supervision of high risk pregnancy, antepartum -Keep checking BP and weight and putting into mychart. Reviewed warning signs and elevated BP readings.  -She still wants Tubal -She still taking ASA -keep growth scan next week.  -Reviewed that Third trimester Pre-e labs normal Preterm labor symptoms and general obstetric precautions including but not limited to vaginal bleeding, contractions, leaking of  fluid and fetal movement were reviewed in detail with the patient. Please refer to After Visit Summary for other counseling recommendations.   Return in about 3 weeks (around 11/14/2019), or LROB on My Chart.  Future Appointments  Date Time Provider [place]  10/31/2019  9:15 AM [facility]-M[facility] NURSE [facility]  10/31/2019  9:15 AM [facility]-M[facility] US2 [facility]  11/14/2019  3:55 PM Ardean Larsen, Mervyn Skeeters, CNM [facility]    [place], CNM

## 2019-10-31 ENCOUNTER — Ambulatory Visit: Payer: Medicaid Other | Admitting: *Deleted

## 2019-10-31 ENCOUNTER — Other Ambulatory Visit: Payer: Self-pay

## 2019-10-31 ENCOUNTER — Other Ambulatory Visit: Payer: Self-pay | Admitting: *Deleted

## 2019-10-31 ENCOUNTER — Ambulatory Visit: Payer: Medicaid Other | Attending: Obstetrics and Gynecology

## 2019-10-31 DIAGNOSIS — O0990 Supervision of high risk pregnancy, unspecified, unspecified trimester: Secondary | ICD-10-CM

## 2019-10-31 DIAGNOSIS — O09293 Supervision of pregnancy with other poor reproductive or obstetric history, third trimester: Secondary | ICD-10-CM

## 2019-10-31 DIAGNOSIS — O99210 Obesity complicating pregnancy, unspecified trimester: Secondary | ICD-10-CM

## 2019-10-31 DIAGNOSIS — E669 Obesity, unspecified: Secondary | ICD-10-CM

## 2019-10-31 DIAGNOSIS — O09299 Supervision of pregnancy with other poor reproductive or obstetric history, unspecified trimester: Secondary | ICD-10-CM | POA: Insufficient documentation

## 2019-10-31 DIAGNOSIS — O99213 Obesity complicating pregnancy, third trimester: Secondary | ICD-10-CM

## 2019-10-31 DIAGNOSIS — Z3A32 32 weeks gestation of pregnancy: Secondary | ICD-10-CM

## 2019-11-14 ENCOUNTER — Encounter: Payer: Self-pay | Admitting: Family Medicine

## 2019-11-14 ENCOUNTER — Telehealth (INDEPENDENT_AMBULATORY_CARE_PROVIDER_SITE_OTHER): Payer: Medicaid Other | Admitting: Student

## 2019-11-14 DIAGNOSIS — Z8759 Personal history of other complications of pregnancy, childbirth and the puerperium: Secondary | ICD-10-CM

## 2019-11-14 DIAGNOSIS — E669 Obesity, unspecified: Secondary | ICD-10-CM

## 2019-11-14 DIAGNOSIS — O99213 Obesity complicating pregnancy, third trimester: Secondary | ICD-10-CM

## 2019-11-14 DIAGNOSIS — O99210 Obesity complicating pregnancy, unspecified trimester: Secondary | ICD-10-CM

## 2019-11-14 DIAGNOSIS — O09299 Supervision of pregnancy with other poor reproductive or obstetric history, unspecified trimester: Secondary | ICD-10-CM

## 2019-11-14 DIAGNOSIS — O0990 Supervision of high risk pregnancy, unspecified, unspecified trimester: Secondary | ICD-10-CM

## 2019-11-14 DIAGNOSIS — Z3A34 34 weeks gestation of pregnancy: Secondary | ICD-10-CM

## 2019-11-14 DIAGNOSIS — O0993 Supervision of high risk pregnancy, unspecified, third trimester: Secondary | ICD-10-CM

## 2019-11-14 NOTE — Progress Notes (Signed)
I connected with  Victoria Trujillo on 04/59/13 at  3:55 PM EDT by telephone and verified that I am speaking with the correct person using two identifiers.   I discussed the limitations, risks, security and privacy concerns of performing an evaluation and management service by telephone and the availability of in person appointments. I also discussed with the patient that there may be a patient responsible charge related to this service. The patient expressed understanding and agreed to proceed.  [name], RN 11/14/2019  3:00 PM

## 2019-11-14 NOTE — Progress Notes (Signed)
Patient ID: Victoria Trujillo, female   DOB: [birth year], 34 y.o.   MRN: [id number] I connected with@ on 11/14/19 at  3:55 PM EDT by: MyChart video and verified that I am speaking with the correct person using two identifiers.  Patient is located at [facility] and provider is located at [facility].     The purpose of this virtual visit is to provide medical care while limiting exposure to the novel coronavirus. I discussed the limitations, risks, security and privacy concerns of performing an evaluation and management service by MyChart video and the availability of in person appointments. I also discussed with the patient that there may be a patient responsible charge related to this service. By engaging in this virtual visit, you consent to the provision of healthcare.  Additionally, you authorize for your insurance to be billed for the services provided during this visit.  The patient expressed understanding and agreed to proceed.  The following staff members participated in the virtual visit:  Leonie Douglas    PRENATAL VISIT NOTE  Subjective:  Victoria Trujillo is a 34 y.o. J6O1157 at [redacted]w[redacted]d  for phone visit for ongoing prenatal care.  She is currently monitored for the following issues for this low-risk pregnancy and has Supervision of high risk pregnancy, antepartum; Obesity in pregnancy; and History of preeclampsia, prior pregnancy, currently pregnant on their problem list.  Patient reports no complaints. She declines headaches, blurry vision, RUQ pain, floating spots.   Contractions: Not present. Vag. Bleeding: None.  Movement: Present. Denies leaking of fluid.   The following portions of the patient's history were reviewed and updated as appropriate: allergies, current medications, past family history, past medical history, past social history, past surgical history and problem list.   Objective:   Vitals:   11/14/19 1510  BP: 111/68  Pulse: 78   Self-Obtained  Fetal Status:     Movement: Present      Assessment and Plan:  Pregnancy: W6O0355 at [redacted]w[redacted]d 1. Supervision of high risk pregnancy, antepartum -Doing well; continue to take baby ASA until 36 weeks -Patient wants work note to give to work for her due date and maternity leave; patient is clear that these are not FMLA papers (she knows that there is a charge for filling out FMLA papers). She will pick up on 7/26.   2. Obesity in pregnancy   3. Hx of preeclampsia, prior pregnancy, currently pregnant -No signs or symptoms; continue to check BP weekly. Patient verbalized understanding of importance of taking BP weekly and monitoring for symptoms.   Preterm labor symptoms and general obstetric precautions including but not limited to vaginal bleeding, contractions, leaking of fluid and fetal movement were reviewed in detail with the patient.  Return in about 2 weeks (around 11/28/2019), or in person with KK.  Future Appointments  Date Time Provider [place]  11/28/2019  8:45 AM [facility] NURSE [facility]  11/28/2019  9:15 AM [facility] US2 [facility]  11/29/2019  9:55 AM [place], Rona Ravens, NP [facility]     Time spent on virtual visit: 20 minutes  Starr Lake, CNM

## 2019-11-28 ENCOUNTER — Ambulatory Visit: Payer: Medicaid Other | Admitting: *Deleted

## 2019-11-28 ENCOUNTER — Other Ambulatory Visit: Payer: Self-pay | Admitting: Maternal & Fetal Medicine

## 2019-11-28 ENCOUNTER — Other Ambulatory Visit: Payer: Self-pay

## 2019-11-28 ENCOUNTER — Other Ambulatory Visit: Payer: Self-pay | Admitting: *Deleted

## 2019-11-28 ENCOUNTER — Ambulatory Visit: Payer: Medicaid Other | Attending: Maternal & Fetal Medicine

## 2019-11-28 DIAGNOSIS — O0990 Supervision of high risk pregnancy, unspecified, unspecified trimester: Secondary | ICD-10-CM | POA: Diagnosis present

## 2019-11-28 DIAGNOSIS — Z3A36 36 weeks gestation of pregnancy: Secondary | ICD-10-CM

## 2019-11-28 DIAGNOSIS — O09299 Supervision of pregnancy with other poor reproductive or obstetric history, unspecified trimester: Secondary | ICD-10-CM | POA: Insufficient documentation

## 2019-11-28 DIAGNOSIS — E669 Obesity, unspecified: Secondary | ICD-10-CM

## 2019-11-28 DIAGNOSIS — O99210 Obesity complicating pregnancy, unspecified trimester: Secondary | ICD-10-CM

## 2019-11-28 DIAGNOSIS — O09293 Supervision of pregnancy with other poor reproductive or obstetric history, third trimester: Secondary | ICD-10-CM | POA: Diagnosis not present

## 2019-11-28 DIAGNOSIS — O99213 Obesity complicating pregnancy, third trimester: Secondary | ICD-10-CM

## 2019-11-28 DIAGNOSIS — O403XX0 Polyhydramnios, third trimester, not applicable or unspecified: Secondary | ICD-10-CM

## 2019-11-29 ENCOUNTER — Other Ambulatory Visit (HOSPITAL_COMMUNITY)
Admission: RE | Admit: 2019-11-29 | Discharge: 2019-11-29 | Disposition: A | Discharge status: Home / Self Care | Payer: Medicaid Other | Source: Ambulatory Visit | Attending: Nurse Practitioner | Admitting: Nurse Practitioner

## 2019-11-29 ENCOUNTER — Ambulatory Visit (INDEPENDENT_AMBULATORY_CARE_PROVIDER_SITE_OTHER): Payer: Medicaid Other | Admitting: Nurse Practitioner

## 2019-11-29 VITALS — BP 126/83 | HR 99

## 2019-11-29 DIAGNOSIS — Z3A36 36 weeks gestation of pregnancy: Secondary | ICD-10-CM

## 2019-11-29 DIAGNOSIS — O403XX0 Polyhydramnios, third trimester, not applicable or unspecified: Secondary | ICD-10-CM

## 2019-11-29 DIAGNOSIS — O0990 Supervision of high risk pregnancy, unspecified, unspecified trimester: Secondary | ICD-10-CM | POA: Diagnosis present

## 2019-11-29 NOTE — Progress Notes (Signed)
    Subjective:  Victoria Trujillo is a 34 y.o. [id number] at [redacted]w[redacted]d being seen today for ongoing prenatal care.  She is currently monitored for the following issues for this high-risk pregnancy and has Supervision of high risk pregnancy, antepartum; Obesity in pregnancy; History of preeclampsia, prior pregnancy, currently pregnant; and Polyhydramnios affecting pregnancy in third trimester on their problem list.  Patient reports occasional contractions.  Contractions: Not present. Vag. Bleeding: None.  Movement: Present. Denies leaking of fluid.   The following portions of the patient's history were reviewed and updated as appropriate: allergies, current medications, past family history, past medical history, past social history, past surgical history and problem list. Problem list updated.  Objective:   Vitals:   11/29/19 1043  BP: 126/83  Pulse: 99    Fetal Status: Fetal Heart Rate (bpm): 146 Fundal Height: 41 cm Movement: Present  Presentation: Vertex  General:  Alert, oriented and cooperative. Patient is in no acute distress.  Skin: Skin is warm and dry. No rash noted.   Cardiovascular: Normal heart rate noted  Respiratory: Normal respiratory effort, no problems with respiration noted  Abdomen: Soft, gravid, appropriate for gestational age. Pain/Pressure: Absent     Pelvic:  Cervical exam performed Dilation: Closed Effacement (%): Thick Station: Ballotable  Presenting part very high.    Extremities: Normal range of motion.  Edema: Trace  Mental Status: Normal mood and affect. Normal behavior. Normal judgment and thought content.   Urinalysis:      Assessment and Plan:  Pregnancy: [id number] at [redacted]w[redacted]d  1. Supervision of high risk pregnancy, antepartum Advised re: ROM with polyhydramnious - expect large gush.  Lie down and call 911 if she feels any cord when water breaks.  Has ultrasound scheduled next week.  Plan for delivery at 39 weeks.  - Culture, beta strep (group b only) -  GC/Chlamydia probe amp ([place])not at [facility]  2. Polyhydramnios affecting pregnancy in third trimester See above  3. [redacted] weeks gestation of pregnancy   Term labor symptoms and general obstetric precautions including but not limited to vaginal bleeding, contractions, leaking of fluid and fetal movement were reviewed in detail with the patient. Please refer to After Visit Summary for other counseling recommendations.  Return in about 1 week (around 12/06/2019) for in person ROB.  Earlie Server, RN, MSN, NP-BC Nurse Practitioner, [facility] for [employer], [employer] 11/29/2019 10:57 AM

## 2019-11-30 LAB — GC/CHLAMYDIA PROBE AMP (~~LOC~~) NOT AT ARMC
Chlamydia: NEGATIVE
Comment: NEGATIVE
Comment: NORMAL
Neisseria Gonorrhea: NEGATIVE

## 2019-12-03 LAB — CULTURE, BETA STREP (GROUP B ONLY): Strep Gp B Culture: NEGATIVE

## 2019-12-05 ENCOUNTER — Encounter: Payer: Self-pay | Admitting: *Deleted

## 2019-12-05 ENCOUNTER — Other Ambulatory Visit: Payer: Self-pay

## 2019-12-05 ENCOUNTER — Ambulatory Visit: Payer: Medicaid Other | Admitting: *Deleted

## 2019-12-05 ENCOUNTER — Ambulatory Visit: Payer: Medicaid Other | Attending: Obstetrics and Gynecology

## 2019-12-05 DIAGNOSIS — O09293 Supervision of pregnancy with other poor reproductive or obstetric history, third trimester: Secondary | ICD-10-CM | POA: Diagnosis not present

## 2019-12-05 DIAGNOSIS — O09299 Supervision of pregnancy with other poor reproductive or obstetric history, unspecified trimester: Secondary | ICD-10-CM | POA: Diagnosis present

## 2019-12-05 DIAGNOSIS — O403XX0 Polyhydramnios, third trimester, not applicable or unspecified: Secondary | ICD-10-CM | POA: Diagnosis not present

## 2019-12-05 DIAGNOSIS — O0990 Supervision of high risk pregnancy, unspecified, unspecified trimester: Secondary | ICD-10-CM

## 2019-12-05 DIAGNOSIS — O99213 Obesity complicating pregnancy, third trimester: Secondary | ICD-10-CM | POA: Diagnosis not present

## 2019-12-05 DIAGNOSIS — E669 Obesity, unspecified: Secondary | ICD-10-CM

## 2019-12-05 DIAGNOSIS — O99210 Obesity complicating pregnancy, unspecified trimester: Secondary | ICD-10-CM

## 2019-12-05 DIAGNOSIS — Z3A37 37 weeks gestation of pregnancy: Secondary | ICD-10-CM

## 2019-12-06 ENCOUNTER — Ambulatory Visit (INDEPENDENT_AMBULATORY_CARE_PROVIDER_SITE_OTHER): Payer: Medicaid Other | Admitting: Obstetrics and Gynecology

## 2019-12-06 ENCOUNTER — Encounter: Payer: Self-pay | Admitting: Obstetrics and Gynecology

## 2019-12-06 ENCOUNTER — Telehealth: Payer: Self-pay

## 2019-12-06 VITALS — BP 112/71 | HR 81 | Wt 252.7 lb

## 2019-12-06 DIAGNOSIS — Z3A37 37 weeks gestation of pregnancy: Secondary | ICD-10-CM

## 2019-12-06 NOTE — Telephone Encounter (Signed)
Called Pt to advise that Induction is scheduled for 12/19/19, advised Pt that they will call her with the Time.Pt verbalized understanding.

## 2019-12-06 NOTE — Progress Notes (Signed)
Prenatal Visit Note Date: 12/06/2019 Clinic: [facility]  Subjective:  Victoria Trujillo is a 34 y.o. [id number] at [redacted]w[redacted]d being seen today for ongoing prenatal care.  She is currently monitored for the following issues for this low-risk pregnancy and has Supervision of high risk pregnancy, antepartum; Obesity in pregnancy; History of preeclampsia, prior pregnancy, currently pregnant; and Polyhydramnios affecting pregnancy in third trimester on their problem list.  Patient reports no complaints.   Contractions: Not present. Vag. Bleeding: None.  Movement: Present. Denies leaking of fluid.   The following portions of the patient's history were reviewed and updated as appropriate: allergies, current medications, past family history, past medical history, past social history, past surgical history and problem list. Problem list updated.  Objective:   Vitals:   12/06/19 1022  BP: 112/71  Pulse: 81  Weight: 252 lb 11.2 oz (114.6 kg)    Fetal Status: Fetal Heart Rate (bpm): 147   Movement: Present     General:  Alert, oriented and cooperative. Patient is in no acute distress.  Skin: Skin is warm and dry. No rash noted.   Cardiovascular: Normal heart rate noted  Respiratory: Normal respiratory effort, no problems with respiration noted  Abdomen: Soft, gravid, appropriate for gestational age. Pain/Pressure: Absent     Pelvic:  Cervical exam deferred        Extremities: Normal range of motion.  Edema: Trace  Mental Status: Normal mood and affect. Normal behavior. Normal judgment and thought content.   Urinalysis:      Assessment and Plan:  Pregnancy: [id number] at [redacted]w[redacted]d  *Pregnancy: OB *Poly: still with mild poly on 2/63: 33.5 cephalic, bpp 8/8. Pt amenable to 39wk IOL->set up for 8/24. Has borderline lga on 8/3, largest prior 3090gm. Normal 2h GTT. Has rpt u/s on 8/17.  Term labor symptoms and general obstetric precautions including but not limited to vaginal bleeding,  contractions, leaking of fluid and fetal movement were reviewed in detail with the patient. Please refer to After Visit Summary for other counseling recommendations.  Return in about 1 week (around 12/13/2019) for high risk, low risk, in person.   Aletha Halim, MD

## 2019-12-12 ENCOUNTER — Encounter: Payer: Self-pay | Admitting: *Deleted

## 2019-12-12 ENCOUNTER — Ambulatory Visit: Payer: Medicaid Other | Attending: Obstetrics and Gynecology

## 2019-12-12 ENCOUNTER — Ambulatory Visit: Payer: Medicaid Other | Admitting: *Deleted

## 2019-12-12 ENCOUNTER — Other Ambulatory Visit: Payer: Self-pay

## 2019-12-12 DIAGNOSIS — O99213 Obesity complicating pregnancy, third trimester: Secondary | ICD-10-CM

## 2019-12-12 DIAGNOSIS — E669 Obesity, unspecified: Secondary | ICD-10-CM | POA: Diagnosis not present

## 2019-12-12 DIAGNOSIS — Z3A38 38 weeks gestation of pregnancy: Secondary | ICD-10-CM

## 2019-12-12 DIAGNOSIS — O99210 Obesity complicating pregnancy, unspecified trimester: Secondary | ICD-10-CM | POA: Insufficient documentation

## 2019-12-12 DIAGNOSIS — O09293 Supervision of pregnancy with other poor reproductive or obstetric history, third trimester: Secondary | ICD-10-CM

## 2019-12-12 DIAGNOSIS — O0990 Supervision of high risk pregnancy, unspecified, unspecified trimester: Secondary | ICD-10-CM | POA: Insufficient documentation

## 2019-12-12 DIAGNOSIS — O09299 Supervision of pregnancy with other poor reproductive or obstetric history, unspecified trimester: Secondary | ICD-10-CM | POA: Insufficient documentation

## 2019-12-12 DIAGNOSIS — O403XX0 Polyhydramnios, third trimester, not applicable or unspecified: Secondary | ICD-10-CM

## 2019-12-13 ENCOUNTER — Other Ambulatory Visit: Payer: Self-pay | Admitting: Advanced Practice Midwife

## 2019-12-13 ENCOUNTER — Ambulatory Visit (INDEPENDENT_AMBULATORY_CARE_PROVIDER_SITE_OTHER): Payer: Medicaid Other | Admitting: Obstetrics and Gynecology

## 2019-12-13 VITALS — BP 119/68 | HR 94 | Wt 249.0 lb

## 2019-12-13 DIAGNOSIS — O403XX0 Polyhydramnios, third trimester, not applicable or unspecified: Secondary | ICD-10-CM

## 2019-12-13 DIAGNOSIS — Z3A38 38 weeks gestation of pregnancy: Secondary | ICD-10-CM

## 2019-12-13 DIAGNOSIS — O0990 Supervision of high risk pregnancy, unspecified, unspecified trimester: Secondary | ICD-10-CM

## 2019-12-13 DIAGNOSIS — O99210 Obesity complicating pregnancy, unspecified trimester: Secondary | ICD-10-CM

## 2019-12-13 DIAGNOSIS — O09299 Supervision of pregnancy with other poor reproductive or obstetric history, unspecified trimester: Secondary | ICD-10-CM

## 2019-12-13 NOTE — Progress Notes (Signed)
Prenatal Visit Note Date: 12/13/2019 Clinic: [facility]  Subjective:  Victoria Trujillo is a 34 y.o. [id number] at [redacted]w[redacted]d being seen today for ongoing prenatal care.  She is currently monitored for the following issues for this high-risk pregnancy and has Supervision of high risk pregnancy, antepartum; Obesity in pregnancy; History of preeclampsia, prior pregnancy, currently pregnant; and Polyhydramnios affecting pregnancy in third trimester on their problem list.  Patient reports no complaints.   Contractions: Not present. Vag. Bleeding: None.  Movement: Present. Denies leaking of fluid.   The following portions of the patient's history were reviewed and updated as appropriate: allergies, current medications, past family history, past medical history, past social history, past surgical history and problem list. Problem list updated.  Objective:   Vitals:   12/13/19 1013  BP: 119/68  Pulse: 94  Weight: 249 lb (112.9 kg)    Fetal Status: Fetal Heart Rate (bpm): 156   Movement: Present     General:  Alert, oriented and cooperative. Patient is in no acute distress.  Skin: Skin is warm and dry. No rash noted.   Cardiovascular: Normal heart rate noted  Respiratory: Normal respiratory effort, no problems with respiration noted  Abdomen: Soft, gravid, appropriate for gestational age. Pain/Pressure: Absent     Pelvic:  Cervical exam deferred        Extremities: Normal range of motion.  Edema: Trace  Mental Status: Normal mood and affect. Normal behavior. Normal judgment and thought content.   Urinalysis:      Assessment and Plan:  Pregnancy: E2V3612 at [redacted]w[redacted]d  1. Supervision of high risk pregnancy, antepartum Routine care. Set up for 8/24 IOL already  2. Polyhydramnios affecting pregnancy in third trimester afi 30, cephalic yesterday. bpp 8/8  Term labor symptoms and general obstetric precautions including but not limited to vaginal bleeding, contractions, leaking of  fluid and fetal movement were reviewed in detail with the patient. Please refer to After Visit Summary for other counseling recommendations.  Return if symptoms worsen or fail to improve.   Aletha Halim, MD

## 2019-12-14 ENCOUNTER — Inpatient Hospital Stay (HOSPITAL_COMMUNITY)
Admission: AD | Admit: 2019-12-14 | Discharge: 2019-12-16 | DRG: 798 | Disposition: A | Discharge status: Home / Self Care | Payer: Medicaid Other | Attending: Obstetrics and Gynecology | Admitting: Obstetrics and Gynecology

## 2019-12-14 ENCOUNTER — Other Ambulatory Visit: Payer: Self-pay

## 2019-12-14 DIAGNOSIS — O403XX0 Polyhydramnios, third trimester, not applicable or unspecified: Secondary | ICD-10-CM | POA: Diagnosis present

## 2019-12-14 DIAGNOSIS — O4292 Full-term premature rupture of membranes, unspecified as to length of time between rupture and onset of labor: Principal | ICD-10-CM | POA: Diagnosis present

## 2019-12-14 DIAGNOSIS — Z302 Encounter for sterilization: Secondary | ICD-10-CM

## 2019-12-14 DIAGNOSIS — O09299 Supervision of pregnancy with other poor reproductive or obstetric history, unspecified trimester: Secondary | ICD-10-CM

## 2019-12-14 DIAGNOSIS — O99210 Obesity complicating pregnancy, unspecified trimester: Secondary | ICD-10-CM

## 2019-12-14 DIAGNOSIS — O134 Gestational [pregnancy-induced] hypertension without significant proteinuria, complicating childbirth: Secondary | ICD-10-CM | POA: Diagnosis present

## 2019-12-14 DIAGNOSIS — Z3A38 38 weeks gestation of pregnancy: Secondary | ICD-10-CM

## 2019-12-14 DIAGNOSIS — Z87891 Personal history of nicotine dependence: Secondary | ICD-10-CM

## 2019-12-14 DIAGNOSIS — Z20822 Contact with and (suspected) exposure to covid-19: Secondary | ICD-10-CM | POA: Diagnosis present

## 2019-12-14 DIAGNOSIS — O409XX0 Polyhydramnios, unspecified trimester, not applicable or unspecified: Secondary | ICD-10-CM | POA: Diagnosis present

## 2019-12-14 DIAGNOSIS — O99214 Obesity complicating childbirth: Secondary | ICD-10-CM | POA: Diagnosis present

## 2019-12-14 DIAGNOSIS — O0990 Supervision of high risk pregnancy, unspecified, unspecified trimester: Secondary | ICD-10-CM

## 2019-12-15 ENCOUNTER — Inpatient Hospital Stay (HOSPITAL_COMMUNITY): Payer: Medicaid Other | Admitting: Anesthesiology

## 2019-12-15 ENCOUNTER — Encounter (HOSPITAL_COMMUNITY)
Admission: AD | Disposition: A | Discharge status: Home / Self Care | Payer: Self-pay | Source: Home / Self Care | Attending: Obstetrics and Gynecology

## 2019-12-15 ENCOUNTER — Encounter (HOSPITAL_COMMUNITY): Payer: Self-pay | Admitting: Obstetrics and Gynecology

## 2019-12-15 DIAGNOSIS — O409XX0 Polyhydramnios, unspecified trimester, not applicable or unspecified: Secondary | ICD-10-CM | POA: Diagnosis present

## 2019-12-15 DIAGNOSIS — Z20822 Contact with and (suspected) exposure to covid-19: Secondary | ICD-10-CM | POA: Diagnosis present

## 2019-12-15 DIAGNOSIS — O4292 Full-term premature rupture of membranes, unspecified as to length of time between rupture and onset of labor: Secondary | ICD-10-CM | POA: Diagnosis present

## 2019-12-15 DIAGNOSIS — Z302 Encounter for sterilization: Secondary | ICD-10-CM | POA: Diagnosis not present

## 2019-12-15 DIAGNOSIS — Z87891 Personal history of nicotine dependence: Secondary | ICD-10-CM | POA: Diagnosis not present

## 2019-12-15 DIAGNOSIS — O403XX0 Polyhydramnios, third trimester, not applicable or unspecified: Secondary | ICD-10-CM | POA: Diagnosis not present

## 2019-12-15 DIAGNOSIS — O134 Gestational [pregnancy-induced] hypertension without significant proteinuria, complicating childbirth: Secondary | ICD-10-CM | POA: Diagnosis present

## 2019-12-15 DIAGNOSIS — O133 Gestational [pregnancy-induced] hypertension without significant proteinuria, third trimester: Secondary | ICD-10-CM | POA: Diagnosis not present

## 2019-12-15 DIAGNOSIS — O26893 Other specified pregnancy related conditions, third trimester: Secondary | ICD-10-CM | POA: Diagnosis present

## 2019-12-15 DIAGNOSIS — O99214 Obesity complicating childbirth: Secondary | ICD-10-CM | POA: Diagnosis present

## 2019-12-15 DIAGNOSIS — Z3A38 38 weeks gestation of pregnancy: Secondary | ICD-10-CM | POA: Diagnosis not present

## 2019-12-15 HISTORY — PX: TUBAL LIGATION: SHX77

## 2019-12-15 LAB — CBC
HCT: 41.8 % (ref 36.0–46.0)
Hemoglobin: 13.1 g/dL (ref 12.0–15.0)
MCH: 27.6 pg (ref 26.0–34.0)
MCHC: 31.3 g/dL (ref 30.0–36.0)
MCV: 88.2 fL (ref 80.0–100.0)
Platelets: 265 10*3/uL (ref 150–400)
RBC: 4.74 MIL/uL (ref 3.87–5.11)
RDW: 14.1 % (ref 11.5–15.5)
WBC: 11 10*3/uL — ABNORMAL HIGH (ref 4.0–10.5)
nRBC: 0 % (ref 0.0–0.2)

## 2019-12-15 LAB — TYPE AND SCREEN
ABO/RH(D): A POS
Antibody Screen: NEGATIVE

## 2019-12-15 LAB — SARS CORONAVIRUS 2 BY RT PCR (HOSPITAL ORDER, PERFORMED IN ~~LOC~~ HOSPITAL LAB): SARS Coronavirus 2: NEGATIVE

## 2019-12-15 SURGERY — LIGATION, FALLOPIAN TUBE, POSTPARTUM
Anesthesia: Epidural

## 2019-12-15 MED ORDER — DIPHENHYDRAMINE HCL 25 MG PO CAPS: 25.0000 mg | ORAL_CAPSULE | Freq: Four times a day (QID) | ORAL | Status: DC | PRN

## 2019-12-15 MED ORDER — LACTATED RINGERS IV SOLN: INTRAVENOUS | Status: AC

## 2019-12-15 MED ORDER — METHYLERGONOVINE MALEATE 0.2 MG PO TABS: 0.2000 mg | ORAL_TABLET | ORAL | Status: DC | PRN

## 2019-12-15 MED ORDER — EPHEDRINE 5 MG/ML INJ: 10.0000 mg | INTRAVENOUS | Status: DC | PRN

## 2019-12-15 MED ORDER — LACTATED RINGERS IV SOLN: 500.0000 mL | INTRAVENOUS | Status: DC | PRN

## 2019-12-15 MED ORDER — FLEET ENEMA 7-19 GM/118ML RE ENEM: 1.0000 | ENEMA | Freq: Every day | RECTAL | Status: DC | PRN

## 2019-12-15 MED ORDER — LIDOCAINE HCL (PF) 1 % IJ SOLN: 30.0000 mL | INTRAMUSCULAR | Status: DC | PRN

## 2019-12-15 MED ORDER — KETOROLAC TROMETHAMINE 30 MG/ML IJ SOLN: 30.0000 mg | Freq: Once | INTRAMUSCULAR | Status: DC | PRN

## 2019-12-15 MED ORDER — DIPHENHYDRAMINE HCL 50 MG/ML IJ SOLN: 12.5000 mg | INTRAMUSCULAR | Status: DC | PRN

## 2019-12-15 MED ORDER — FENTANYL CITRATE (PF) 100 MCG/2ML IJ SOLN
INTRAMUSCULAR | Status: DC | PRN
Start: 2019-12-15 — End: 2019-12-15
  Administered 2019-12-15: 100 ug via EPIDURAL

## 2019-12-15 MED ORDER — SODIUM BICARBONATE 8.4 % IV SOLN
INTRAVENOUS | Status: DC | PRN
  Administered 2019-12-15: 3 mL via EPIDURAL
  Administered 2019-12-15 (×2): 5 mL via EPIDURAL
  Administered 2019-12-15: 2 mL via EPIDURAL
  Administered 2019-12-15: 5 mL via EPIDURAL

## 2019-12-15 MED ORDER — LACTATED RINGERS IV SOLN: 500.0000 mL | Freq: Once | INTRAVENOUS | Status: DC

## 2019-12-15 MED ORDER — COCONUT OIL OIL: 1.0000 "application " | TOPICAL_OIL | Status: DC | PRN

## 2019-12-15 MED ORDER — TETANUS-DIPHTH-ACELL PERTUSSIS 5-2.5-18.5 LF-MCG/0.5 IM SUSP: 0.5000 mL | Freq: Once | INTRAMUSCULAR | Status: DC

## 2019-12-15 MED ORDER — SOD CITRATE-CITRIC ACID 500-334 MG/5ML PO SOLN: 30.0000 mL | ORAL | Status: DC | PRN

## 2019-12-15 MED ORDER — FENTANYL CITRATE (PF) 100 MCG/2ML IJ SOLN
50.0000 ug | Freq: Once | INTRAMUSCULAR | Status: AC
  Administered 2019-12-15: 50 ug via INTRAVENOUS

## 2019-12-15 MED ORDER — FAMOTIDINE 20 MG PO TABS
40.0000 mg | ORAL_TABLET | Freq: Once | ORAL | Status: AC
  Administered 2019-12-15: 40 mg via ORAL
  Filled 2019-12-15: qty 2

## 2019-12-15 MED ORDER — METOCLOPRAMIDE HCL 10 MG PO TABS
10.0000 mg | ORAL_TABLET | Freq: Once | ORAL | Status: AC
  Administered 2019-12-15: 10 mg via ORAL
  Filled 2019-12-15: qty 1

## 2019-12-15 MED ORDER — ONDANSETRON HCL 4 MG/2ML IJ SOLN
INTRAMUSCULAR | Status: DC | PRN
  Administered 2019-12-15: 4 mg via INTRAVENOUS

## 2019-12-15 MED ORDER — OXYTOCIN-SODIUM CHLORIDE 30-0.9 UT/500ML-% IV SOLN
2.5000 [IU]/h | INTRAVENOUS | Status: DC
  Filled 2019-12-15: qty 500

## 2019-12-15 MED ORDER — FENTANYL CITRATE (PF) 100 MCG/2ML IJ SOLN
50.0000 ug | INTRAMUSCULAR | Status: DC | PRN
  Administered 2019-12-15: 50 ug via INTRAVENOUS
  Filled 2019-12-15 (×2): qty 2

## 2019-12-15 MED ORDER — PROMETHAZINE HCL 25 MG/ML IJ SOLN: 6.2500 mg | INTRAMUSCULAR | Status: DC | PRN

## 2019-12-15 MED ORDER — BISACODYL 10 MG RE SUPP: 10.0000 mg | Freq: Every day | RECTAL | Status: DC | PRN

## 2019-12-15 MED ORDER — LACTATED RINGERS IV SOLN: INTRAVENOUS | Status: DC

## 2019-12-15 MED ORDER — PRENATAL MULTIVITAMIN CH
1.0000 | ORAL_TABLET | Freq: Every day | ORAL | Status: DC
  Administered 2019-12-16: 1 via ORAL
  Filled 2019-12-15: qty 1

## 2019-12-15 MED ORDER — ERYTHROMYCIN 5 MG/GM OP OINT
TOPICAL_OINTMENT | OPHTHALMIC | Status: AC
  Filled 2019-12-15: qty 1

## 2019-12-15 MED ORDER — ONDANSETRON HCL 4 MG/2ML IJ SOLN: 4.0000 mg | INTRAMUSCULAR | Status: DC | PRN

## 2019-12-15 MED ORDER — BUPIVACAINE HCL (PF) 0.5 % IJ SOLN
INTRAMUSCULAR | Status: AC
  Filled 2019-12-15: qty 30

## 2019-12-15 MED ORDER — ONDANSETRON HCL 4 MG/2ML IJ SOLN: 4.0000 mg | Freq: Four times a day (QID) | INTRAMUSCULAR | Status: DC | PRN

## 2019-12-15 MED ORDER — LIDOCAINE HCL (PF) 1 % IJ SOLN
INTRAMUSCULAR | Status: DC | PRN
  Administered 2019-12-15: 10 mL via EPIDURAL

## 2019-12-15 MED ORDER — FENTANYL CITRATE (PF) 100 MCG/2ML IJ SOLN
INTRAMUSCULAR | Status: AC
  Filled 2019-12-15: qty 2

## 2019-12-15 MED ORDER — FENTANYL-BUPIVACAINE-NACL 0.5-0.125-0.9 MG/250ML-% EP SOLN
12.0000 mL/h | EPIDURAL | Status: DC | PRN
  Filled 2019-12-15: qty 250

## 2019-12-15 MED ORDER — MIDAZOLAM HCL 2 MG/2ML IJ SOLN
INTRAMUSCULAR | Status: AC
  Filled 2019-12-15: qty 2

## 2019-12-15 MED ORDER — EPINEPHRINE PF 1 MG/ML IJ SOLN
INTRAMUSCULAR | Status: AC
  Filled 2019-12-15: qty 1

## 2019-12-15 MED ORDER — IBUPROFEN 600 MG PO TABS
600.0000 mg | ORAL_TABLET | Freq: Four times a day (QID) | ORAL | Status: DC
  Administered 2019-12-15 – 2019-12-16 (×6): 600 mg via ORAL
  Filled 2019-12-15 (×6): qty 1

## 2019-12-15 MED ORDER — BENZOCAINE-MENTHOL 20-0.5 % EX AERO
1.0000 "application " | INHALATION_SPRAY | CUTANEOUS | Status: DC | PRN
  Administered 2019-12-15: 1 via TOPICAL
  Filled 2019-12-15: qty 56

## 2019-12-15 MED ORDER — LIDOCAINE HCL (PF) 2 % IJ SOLN
INTRAMUSCULAR | Status: AC
  Filled 2019-12-15: qty 5

## 2019-12-15 MED ORDER — DIBUCAINE (PERIANAL) 1 % EX OINT: 1.0000 "application " | TOPICAL_OINTMENT | CUTANEOUS | Status: DC | PRN

## 2019-12-15 MED ORDER — LACTATED RINGERS IV SOLN: INTRAVENOUS | Status: DC | PRN

## 2019-12-15 MED ORDER — MEPERIDINE HCL 25 MG/ML IJ SOLN: 6.2500 mg | INTRAMUSCULAR | Status: DC | PRN

## 2019-12-15 MED ORDER — MEASLES, MUMPS & RUBELLA VAC IJ SOLR: 0.5000 mL | Freq: Once | INTRAMUSCULAR | Status: DC

## 2019-12-15 MED ORDER — SIMETHICONE 80 MG PO CHEW: 80.0000 mg | CHEWABLE_TABLET | ORAL | Status: DC | PRN

## 2019-12-15 MED ORDER — METHYLERGONOVINE MALEATE 0.2 MG/ML IJ SOLN: 0.2000 mg | INTRAMUSCULAR | Status: DC | PRN

## 2019-12-15 MED ORDER — SODIUM CHLORIDE 0.9 % IR SOLN
Status: DC | PRN
  Administered 2019-12-15: 1

## 2019-12-15 MED ORDER — OXYCODONE HCL 5 MG PO TABS
5.0000 mg | ORAL_TABLET | Freq: Four times a day (QID) | ORAL | Status: DC | PRN
  Administered 2019-12-15 – 2019-12-16 (×2): 5 mg via ORAL
  Filled 2019-12-15 (×2): qty 1

## 2019-12-15 MED ORDER — PHENYLEPHRINE 40 MCG/ML (10ML) SYRINGE FOR IV PUSH (FOR BLOOD PRESSURE SUPPORT): 80.0000 ug | PREFILLED_SYRINGE | INTRAVENOUS | Status: DC | PRN

## 2019-12-15 MED ORDER — WITCH HAZEL-GLYCERIN EX PADS: 1.0000 "application " | MEDICATED_PAD | CUTANEOUS | Status: DC | PRN

## 2019-12-15 MED ORDER — ZOLPIDEM TARTRATE 5 MG PO TABS: 5.0000 mg | ORAL_TABLET | Freq: Every evening | ORAL | Status: DC | PRN

## 2019-12-15 MED ORDER — HYDROMORPHONE HCL 1 MG/ML IJ SOLN: 0.2500 mg | INTRAMUSCULAR | Status: DC | PRN

## 2019-12-15 MED ORDER — DOCUSATE SODIUM 100 MG PO CAPS
100.0000 mg | ORAL_CAPSULE | Freq: Two times a day (BID) | ORAL | Status: DC
  Administered 2019-12-15 – 2019-12-16 (×2): 100 mg via ORAL
  Filled 2019-12-15 (×2): qty 1

## 2019-12-15 MED ORDER — ONDANSETRON HCL 4 MG PO TABS: 4.0000 mg | ORAL_TABLET | ORAL | Status: DC | PRN

## 2019-12-15 MED ORDER — KETOROLAC TROMETHAMINE 30 MG/ML IJ SOLN
INTRAMUSCULAR | Status: AC
  Filled 2019-12-15: qty 1

## 2019-12-15 MED ORDER — MIDAZOLAM HCL 5 MG/5ML IJ SOLN
INTRAMUSCULAR | Status: DC | PRN
  Administered 2019-12-15 (×2): 1 mg via INTRAVENOUS

## 2019-12-15 MED ORDER — OXYTOCIN BOLUS FROM INFUSION
333.0000 mL | Freq: Once | INTRAVENOUS | Status: AC
  Administered 2019-12-15: 333 mL via INTRAVENOUS

## 2019-12-15 MED ORDER — ONDANSETRON HCL 4 MG/2ML IJ SOLN
INTRAMUSCULAR | Status: AC
  Filled 2019-12-15: qty 2

## 2019-12-15 MED ORDER — FERROUS SULFATE 325 (65 FE) MG PO TABS
325.0000 mg | ORAL_TABLET | Freq: Two times a day (BID) | ORAL | Status: DC
  Administered 2019-12-15 – 2019-12-16 (×2): 325 mg via ORAL
  Filled 2019-12-15 (×3): qty 1

## 2019-12-15 MED ORDER — BUPIVACAINE HCL (PF) 0.75 % IJ SOLN
INTRAMUSCULAR | Status: DC | PRN
Start: 2019-12-15 — End: 2019-12-15
  Administered 2019-12-15: 12 mL/h via EPIDURAL

## 2019-12-15 MED ORDER — BUPIVACAINE HCL 0.5 % IJ SOLN
INTRAMUSCULAR | Status: DC | PRN
  Administered 2019-12-15: 30 mL

## 2019-12-15 MED ORDER — ACETAMINOPHEN 325 MG PO TABS
650.0000 mg | ORAL_TABLET | ORAL | Status: DC | PRN
  Administered 2019-12-15 – 2019-12-16 (×3): 650 mg via ORAL
  Filled 2019-12-15 (×3): qty 2

## 2019-12-15 MED ORDER — ACETAMINOPHEN 325 MG PO TABS: 650.0000 mg | ORAL_TABLET | ORAL | Status: DC | PRN

## 2019-12-15 SURGICAL SUPPLY — 29 items
ADH SKN CLS APL DERMABOND .7 (GAUZE/BANDAGES/DRESSINGS) ×1
CLOTH BEACON ORANGE TIMEOUT ST (SAFETY) ×2 IMPLANT
DERMABOND ADVANCED (GAUZE/BANDAGES/DRESSINGS) ×1
DERMABOND ADVANCED .7 DNX12 (GAUZE/BANDAGES/DRESSINGS) ×1 IMPLANT
DRSG OPSITE POSTOP 3X4 (GAUZE/BANDAGES/DRESSINGS) ×2 IMPLANT
DURAPREP 26ML APPLICATOR (WOUND CARE) ×2 IMPLANT
ELECT REM PT RETURN 9FT ADLT (ELECTROSURGICAL) ×2
ELECTRODE REM PT RTRN 9FT ADLT (ELECTROSURGICAL) ×1 IMPLANT
GLOVE BIO SURGEON STRL SZ7 (GLOVE) ×2 IMPLANT
GLOVE BIOGEL PI IND STRL 7.0 (GLOVE) ×1 IMPLANT
GLOVE BIOGEL PI IND STRL 7.5 (GLOVE) ×1 IMPLANT
GLOVE BIOGEL PI INDICATOR 7.0 (GLOVE) ×1
GLOVE BIOGEL PI INDICATOR 7.5 (GLOVE) ×1
GOWN STRL REUS W/TWL LRG LVL3 (GOWN DISPOSABLE) ×4 IMPLANT
NEEDLE HYPO 22GX1.5 SAFETY (NEEDLE) ×2 IMPLANT
NS IRRIG 1000ML POUR BTL (IV SOLUTION) ×2 IMPLANT
PACK ABDOMINAL MINOR (CUSTOM PROCEDURE TRAY) ×2 IMPLANT
PENCIL BUTTON HOLSTER BLD 10FT (ELECTRODE) IMPLANT
PROTECTOR NERVE ULNAR (MISCELLANEOUS) ×2 IMPLANT
SPONGE LAP 4X18 RFD (DISPOSABLE) ×2 IMPLANT
SUT MNCRL AB 4-0 PS2 18 (SUTURE) ×2 IMPLANT
SUT PLAIN 2 0 (SUTURE) ×4
SUT PLAIN ABS 2-0 CT1 27XMFL (SUTURE) IMPLANT
SUT VIC AB 2-0 SH 27 (SUTURE) ×6
SUT VIC AB 2-0 SH 27XBRD (SUTURE) IMPLANT
SUT VICRYL 0 TIES 12 18 (SUTURE) IMPLANT
SUT VICRYL 0 UR6 27IN ABS (SUTURE) ×2 IMPLANT
SYR CONTROL 10ML LL (SYRINGE) ×2 IMPLANT
TOWEL OR 17X24 6PK STRL BLUE (TOWEL DISPOSABLE) ×4 IMPLANT

## 2019-12-15 NOTE — Anesthesia Postprocedure Evaluation (Signed)
Anesthesia Post Note  Patient: Victoria Trujillo  Procedure(s) Performed: AN AD HOC LABOR EPIDURAL     Patient location during evaluation: Mother Baby Anesthesia Type: Epidural Level of consciousness: awake and alert Pain management: pain level controlled Vital Signs Assessment: post-procedure vital signs reviewed and stable Respiratory status: spontaneous breathing, nonlabored ventilation and respiratory function stable Cardiovascular status: stable Postop Assessment: no headache, no backache and epidural receding Anesthetic complications: no   No complications documented.  Last Vitals:  Vitals:   12/15/19 0458 12/15/19 0603  BP: (!) 145/71 139/77  Pulse: 92 86  Resp: 18 18  Temp: 37.4 C 37.2 C  SpO2: 99% 98%    Last Pain:  Vitals:   12/15/19 0603  TempSrc: Axillary  PainSc: 6    Pain Goal: Patients Stated Pain Goal: 0 (12/15/19 0001)                 Ollivander See

## 2019-12-15 NOTE — Transfer of Care (Signed)
Immediate Anesthesia Transfer of Care Note  Patient: Victoria Trujillo  Procedure(s) Performed: POST PARTUM TUBAL LIGATION (N/A )  Patient Location: PACU  Anesthesia Type:Epidural  Level of Consciousness: awake, alert  and patient cooperative  Airway & Oxygen Therapy: Patient Spontanous Breathing  Post-op Assessment: Report given to RN and Post -op Vital signs reviewed and stable  Post vital signs: Reviewed and stable  Last Vitals:  Vitals Value Taken Time  BP 105/89 12/15/19 1600  Temp    Pulse 82 12/15/19 1601  Resp 20 12/15/19 1601  SpO2 99 % 12/15/19 1601  Vitals shown include unvalidated device data.  Last Pain:  Vitals:   12/15/19 1346  TempSrc: Oral  PainSc:       Patients Stated Pain Goal: 0 (14/99/69 2493)  Complications: No complications documented.

## 2019-12-15 NOTE — Anesthesia Procedure Notes (Signed)
Epidural Patient location during procedure: OB Start time: 12/15/2019 1:56 AM End time: 12/15/2019 2:06 AM  Staffing Anesthesiologist: Lidia Collum, MD Performed: anesthesiologist   Preanesthetic Checklist Completed: patient identified, IV checked, risks and benefits discussed, monitors and equipment checked, pre-op evaluation and timeout performed  Epidural Patient position: sitting Prep: DuraPrep Patient monitoring: heart rate, continuous pulse ox and blood pressure Approach: midline Location: L3-L4 Injection technique: LOR air  Needle:  Needle type: Tuohy  Needle gauge: 17 G Needle length: 9 cm Needle insertion depth: 8 cm Catheter type: closed end flexible Catheter size: 19 Gauge Catheter at skin depth: 14 cm Test dose: negative  Assessment Events: blood not aspirated, injection not painful, no injection resistance, no paresthesia and negative IV test  Additional Notes Reason for block:procedure for pain

## 2019-12-15 NOTE — Anesthesia Preprocedure Evaluation (Addendum)
Anesthesia Evaluation  Patient identified by MRN, date of birth, ID band Patient awake    Reviewed: Allergy & Precautions, H&P , NPO status , Patient's Chart, lab work & pertinent test results  History of Anesthesia Complications Negative for: history of anesthetic complications  Airway Mallampati: II  TM Distance: >3 FB Neck ROM: full    Dental no notable dental hx.    Pulmonary neg pulmonary ROS, former smoker,    Pulmonary exam normal        Cardiovascular hypertension, negative cardio ROS Normal cardiovascular exam Rhythm:regular Rate:Normal     Neuro/Psych negative neurological ROS  negative psych ROS   GI/Hepatic negative GI ROS, Neg liver ROS,   Endo/Other  Morbid obesity  Renal/GU negative Renal ROS  negative genitourinary   Musculoskeletal   Abdominal   Peds  Hematology negative hematology ROS (+)   Anesthesia Other Findings   Reproductive/Obstetrics (+) Pregnancy                            Anesthesia Physical Anesthesia Plan  ASA: III  Anesthesia Plan: Epidural   Post-op Pain Management:    Induction:   PONV Risk Score and Plan:   Airway Management Planned:   Additional Equipment:   Intra-op Plan:   Post-operative Plan:   Informed Consent: I have reviewed the patients History and Physical, chart, labs and discussed the procedure including the risks, benefits and alternatives for the proposed anesthesia with the patient or authorized representative who has indicated his/her understanding and acceptance.       Plan Discussed with:   Anesthesia Plan Comments:        Anesthesia Quick Evaluation

## 2019-12-15 NOTE — MAU Note (Signed)
Had "pop" and gush of fld about an hour ago and continues to leak. Some ctxs. Was not dilated at last ck a wk ago.

## 2019-12-15 NOTE — Lactation Note (Addendum)
This note was copied from a baby's chart. Lactation Consultation Note  Patient Name: Victoria Trujillo QMVHQ'[id number] Date: 12/15/2019      P3, 14 hours  38 weeks  Per RN, Victoria Trujillo, Mom does not want LC services at this time.    Maternal Data    Feeding Feeding Type: Formula Nipple Type: Slow - flow  LATCH Score                   Interventions    Lactation Tools Discussed/Used     Consult Status      Victoria Trujillo  Victoria Trujillo 12/15/2019, 5:21 PM

## 2019-12-15 NOTE — Progress Notes (Signed)
OB Note BTL papers UTD. I d/w her re: r/b/a to [place] in particular permnanecy of the procedure. 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 Pt would like to proceed with BTL. Pt confirms NPO. Can proceed when OR is ready  Durene Romans MD Attending Center for [employer] (Faculty Practice) 12/15/2019 Time: [phone]

## 2019-12-15 NOTE — Anesthesia Preprocedure Evaluation (Addendum)
Anesthesia Evaluation  Patient identified by MRN, date of birth, ID band Patient awake    Reviewed: Allergy & Precautions, H&P , NPO status , Patient's Chart, lab work & pertinent test results  History of Anesthesia Complications Negative for: history of anesthetic complications  Airway Mallampati: I       Dental no notable dental hx. (+) Teeth Intact   Pulmonary neg pulmonary ROS, former smoker,    Pulmonary exam normal breath sounds clear to auscultation       Cardiovascular hypertension, Normal cardiovascular exam Rhythm:Regular Rate:Normal     Neuro/Psych negative psych ROS   GI/Hepatic negative GI ROS, Neg liver ROS,   Endo/Other  Morbid obesity  Renal/GU negative Renal ROS  negative genitourinary   Musculoskeletal   Abdominal (+) + obese,   Peds  Hematology negative hematology ROS (+)   Anesthesia Other Findings   Reproductive/Obstetrics (+) Pregnancy                            Anesthesia Physical  Anesthesia Plan  ASA: III  Anesthesia Plan: Epidural   Post-op Pain Management:    Induction:   PONV Risk Score and Plan: 3 and Ondansetron, Dexamethasone and Scopolamine patch - Pre-op  Airway Management Planned:   Additional Equipment: None  Intra-op Plan:   Post-operative Plan:   Informed Consent: I have reviewed the patients History and Physical, chart, labs and discussed the procedure including the risks, benefits and alternatives for the proposed anesthesia with the patient or authorized representative who has indicated his/her understanding and acceptance.       Plan Discussed with: CRNA  Anesthesia Plan Comments:        Anesthesia Quick Evaluation

## 2019-12-15 NOTE — Discharge Summary (Signed)
Postpartum Discharge Summary   Patient Name: Victoria Trujillo DOB: 33/28/2080 MRN: [id number]  Date of admission: 12/14/2019 Delivery date:12/15/2019  Delivering provider: Christin Fudge  Date of discharge: 12/16/2019  Admitting diagnosis: Polyhydramnios affecting pregnancy [O40.9XX0] Intrauterine pregnancy: [redacted]w[redacted]d    Secondary diagnosis:  Active Problems:   Polyhydramnios affecting pregnancy  Additional problems: gHTN, history of pre-eclampsia vs gHTN in previous pregnancy Discharge diagnosis: Term Pregnancy Delivered                                              Post partum procedures:postpartum tubal ligation Augmentation: none Complications: None  Hospital course: Onset of Labor With Vaginal Delivery      34y.o. yo [id number] 336w3das admitted in Active Labor on 12/14/2019. Patient had an uncomplicated labor course as follows:  Membrane Rupture Time/Date: 11:00 PM ,12/14/2019   Delivery Method:Vaginal, Spontaneous  Episiotomy: None  Lacerations:  None  Patient had an uncomplicated postpartum course.  She is ambulating, tolerating a regular diet, passing flatus, and urinating well. Patient is discharged home in stable condition on 12/16/19.  Newborn Data: Birth date:12/15/2019  Birth time:3:04 AM  Gender:Female  Living status:Living  Apgars:9 ,9  Weight:3405 g   Magnesium Sulfate received: No BMZ received: No Rhophylac:N/A MMR:N/A T-DaP:Given prenatally Flu: N/A Transfusion:No  Physical exam  Vitals:   12/15/19 2059 12/16/19 0006 12/16/19 0438 12/16/19 0900  BP: 123/74 133/67 129/69 117/75  Pulse: 72 72 66 70  Resp: '18 16 16 16  ' Temp: 98.7 F (37.1 C) 98.4 F (36.9 C) 98.2 F (36.8 C) 98.3 F (36.8 C)  TempSrc: Oral Oral Oral Oral  SpO2: 98% 99% 97% 98%   General: alert, cooperative and no distress Lochia: appropriate Uterine Fundus: firm Incision: Healing well with no significant drainage DVT Evaluation: No evidence of DVT seen on physical  exam. No cords or calf tenderness. Labs: Lab Results  Component Value Date   WBC 11.0 (H) 12/15/2019   HGB 13.1 12/15/2019   HCT 41.8 12/15/2019   MCV 88.2 12/15/2019   PLT 265 12/15/2019   CMP Latest Ref Rng & Units 09/29/2019  Glucose 65 - 99 mg/dL 73  BUN 6 - 20 mg/dL 7  Creatinine 0.57 - 1.00 mg/dL 0.62  Sodium 134 - 144 mmol/L 138  Potassium 3.5 - 5.2 mmol/L 4.2  Chloride 96 - 106 mmol/L 104  CO2 20 - 29 mmol/L 20  Calcium 8.7 - 10.2 mg/dL 9.2  Total Protein 6.0 - 8.5 g/dL 6.5  Total Bilirubin 0.0 - 1.2 mg/dL 0.2  Alkaline Phos 48 - 121 IU/L 87  AST 0 - 40 IU/L 22  ALT 0 - 32 IU/L 30   Edinburgh Score: Edinburgh Postnatal Depression Scale Screening Tool 12/15/2019  I have been able to laugh and see the funny side of things. (No Data)     After visit meds:  Allergies as of 12/16/2019   No Known Allergies     Medication List    STOP taking these medications   aspirin EC 81 MG tablet     TAKE these medications   coconut oil Oil Apply 1 application topically as needed.   ibuprofen 600 MG tablet Commonly known as: ADVIL Take 1 tablet (600 mg total) by mouth every 6 (six) hours.   oxyCODONE 5 MG immediate release tablet Commonly known as: Oxy IR/ROXICODONE  Take 1 tablet (5 mg total) by mouth every 6 (six) hours as needed for up to 20 doses for breakthrough pain.   PROBIOTIC-10 PO Take 1 capsule by mouth daily.   Vitafol Gummies 3.33-0.333-34.8 MG Chew Chew 1 tablet by mouth daily.        Discharge home in stable condition Infant Feeding: No evidence of DVT seen on physical exam. No cords or calf tenderness. Infant Disposition:home with mother Discharge instruction: per After Visit Summary and Postpartum booklet. Activity: Advance as tolerated. Pelvic rest for 6 weeks.  Diet: routine diet Future Appointments: Future Appointments  Date Time Provider [place]  01/16/2020  3:34 PM Arrie Senate, MD [facility]   Follow up Visit:   [place] [facility] [place] at [facility] Follow up.   Specialty: Obstetrics and Gynecology Why: in 4 weeks for a postpartum exam Contact information: [address] [place] [address] [phone]               Please schedule this patient for a Virtual postpartum visit in 4 weeks with the following provider: Any provider. Additional Postpartum F/U:  High risk pregnancy complicated by:  polyhydramnios Delivery mode:  Vaginal, Spontaneous  Anticipated Birth Control:  BTL done PP   12/16/2019 Wende Mott, CNM

## 2019-12-15 NOTE — H&P (Addendum)
OBSTETRIC ADMISSION HISTORY AND PHYSICAL  Victoria Trujillo is a 34 y.o. female [phone] with IUP at 14w3dby LMP presenting following SROM at 2300 on 12/14/19. She reports +FMs, no VB, no blurry vision, headaches or peripheral edema, and RUQ pain.  She plans on breast and bottle feeding. She request postpartum BTL for birth control. She [facility] --->  Estimated Date of Delivery: 12/26/19  Sono:    '@[redacted]w[redacted]d' , + 1 week dates, normal anatomy, cephalic presentation, fundal placenta, polyhydramnios with AFI > 97th %, 3256g, 89% EFW   Prenatal History/Complications:  - gHTN -- ruled in in MAU, mild range pressures, asymptomatic - Polyhydramnios in 3rd trimester - Borderline LGA, EFW in 89th % - History of preeclampsia vs gHTN in previous pregnancy  Past Medical History: Past Medical History:  Diagnosis Date   Headache(784.0)    Infection    UTI   Ovarian cyst    Preeclampsia     Past Surgical History: Past Surgical History:  Procedure Laterality Date   LAPAROSCOPIC OVARIAN CYSTECTOMY Right 02/02/2017   Procedure: LAPAROSCOPIC OVARIAN CYSTECTOMY;  Surgeon: PDonnamae Jude MD;  [facility];  Service: Gynecology;  Laterality: Right;   THERAPEUTIC ABORTION     WISDOM TOOTH EXTRACTION      Obstetrical History: OB History     Gravida  5   Para  2   Term  2   Preterm  0   AB  2   Living  2      SAB  0   TAB  1   Ectopic  1   Multiple  0   Live Births  2           Social History Social History   Socioeconomic History   Marital status: Married    Spouse name: Not on file   Number of children: Not on file   Years of education: Not on file   Highest education level: Not on file  Occupational History   Not on file  Tobacco Use   Smoking status: Former Smoker    Types: Cigars    Quit date: 05/21/2009    Years since quitting: 10.5   Smokeless tobacco: Never Used   Tobacco comment: not daily  Vaping Use    Vaping Use: Never used  Substance and Sexual Activity   Alcohol use: Not Currently    Comment: occasional   Drug use: No   Sexual activity: Yes    Birth control/protection: None  Other Topics Concern   Not on file  Social History Narrative   Not on file   Social Determinants of Health   Financial Resource Strain:    Difficulty of Paying Living Expenses: Not on file  Food Insecurity: No Food Insecurity   Worried About Running Out of Food in the Last Year: Never true   Ran Out of Food in the Last Year: Never true  Transportation Needs: No Transportation Needs   Lack of Transportation (Medical): No   Lack of Transportation (Non-Medical): No  Physical Activity:    Days of Exercise per Week: Not on file   Minutes of Exercise per Session: Not on file  Stress:    Feeling of Stress : Not on file  Social Connections:    Frequency of Communication with Friends and Family: Not on file   Frequency of Social Gatherings with Friends and Family: Not on file   Attends Religious Services:  Not on file   Active Member of Clubs or Organizations: Not on file   Attends Club or Organization Meetings: Not on file   Marital Status: Not on file    Family History: Family History  Problem Relation Age of Onset   Hypertension Mother    Stroke Mother    Heart disease Mother    Diabetes Father    Hypertension Father    Heart disease Father    Stroke Father    Hypertension Maternal Grandmother    Cancer Maternal Grandmother    Kidney disease Maternal Grandfather     Allergies: No Known Allergies  Medications Prior to Admission  Medication Sig Dispense Refill Last Dose   aspirin EC 81 MG tablet Take 1 tablet (81 mg total) by mouth daily. Take after 12 weeks for prevention of preeclampsia later in pregnancy 300 tablet 2 Past Month at Unknown time   Prenatal Vit-Fe Phos-FA-Omega (VITAFOL GUMMIES) 3.33-0.333-34.8 MG CHEW Chew 1 tablet by mouth daily. 90 tablet 5 12/14/2019 at Unknown time    Probiotic Product (PROBIOTIC-10 PO) Take by mouth.   12/14/2019 at Unknown time     Review of Systems   All systems reviewed and negative except as stated in HPI  Blood pressure (!) 156/92, pulse (!) 106, temperature 98.7 F (37.1 C), temperature source Oral, resp. rate 18, last menstrual period 03/21/2019, unknown if currently breastfeeding. General appearance: alert, cooperative and no distress Lungs: clear to auscultation bilaterally Heart: regular rate and rhythm Abdomen: soft, non-tender; bowel sounds normal Pelvic: 8/90/-1 on initial check Extremities: Non-tender, no sign of DVT Presentation: cephalic Fetal monitoringBaseline: 120 bpm, Variability: Good {> 6 bpm), Accelerations: Reactive and Decelerations: Variable: mild Uterine activityFrequency: Every 2-4 minutes Dilation: 8 Effacement (%): 90 Station: -1 Exam by:: [employer] RN    Prenatal labs: ABO, Rh: --/--/A POS (08/20 2194) Antibody: NEG (08/20 0035) Rubella: 1.14 (02/11 1223) RPR: Non Reactive (06/04 0834)  HBsAg: Negative (03/10 1053)  HIV: Non Reactive (06/04 0834)  GBS: Negative/-- (08/04 1032)  2 hr Glucola passed, early HgbA1c 5.1 Genetic screening  Low risk NIPS, negative AFP Anatomy US normal, required follow up for incomplete visualization  Prenatal Transfer Tool  Maternal Diabetes: No Genetic Screening: Normal Maternal Ultrasounds/Referrals: Other: Polyhydramnions Fetal Ultrasounds or other Referrals:  None Maternal Substance Abuse:  No Significant Maternal Medications:  None Significant Maternal Lab Results: None  Results for orders placed or performed during the hospital encounter of 12/14/19 (from the past 24 hour(s))  CBC   Collection Time: 12/15/19 12:28 AM  Result Value Ref Range   WBC 11.0 (H) 4.0 - 10.5 K/uL   RBC 4.74 3.87 - 5.11 MIL/uL   Hemoglobin 13.1 12.0 - 15.0 g/dL   HCT 41.8 36 - 46 %   MCV 88.2 80.0 - 100.0 fL   MCH 27.6 26.0 - 34.0 pg   MCHC 31.3 30.0 - 36.0 g/dL    RDW 14.1 11.5 - 15.5 %   Platelets 265 150 - 400 K/uL   nRBC 0.0 0.0 - 0.2 %  SARS Coronavirus 2 by RT PCR (hospital order, performed in [facility] hospital lab) Nasopharyngeal Nasopharyngeal Swab   Collection Time: 12/15/19 12:35 AM   Specimen: Nasopharyngeal Swab  Result Value Ref Range   SARS Coronavirus 2 NEGATIVE NEGATIVE  Type and screen   Collection Time: 12/15/19 12:35 AM  Result Value Ref Range   ABO/RH(D) A POS    Antibody Screen NEG    Sample Expiration  12/18/2019,2359 Performed at [facility] [address], [place], [place] [address]     Patient Active Problem List   Diagnosis Date Noted   Polyhydramnios affecting pregnancy 12/15/2019   Polyhydramnios affecting pregnancy in third trimester 11/29/2019   Supervision of high risk pregnancy, antepartum 05/22/2019   Obesity in pregnancy 05/22/2019   History of preeclampsia, prior pregnancy, currently pregnant 05/22/2019    Assessment/Plan:  Victoria Trujillo is a 34 y.o. [id number] at 27w3dhere following SROM at 2300 on 12/14/19.  #Labor: Progressing well, now dilated to 8cm. Will defer further augmentation and allow patient to continue to labor. #Pain: Per patient request, desires epidural #FWB: Cat I  #ID: Negative #MOF: Breast and bottle #MOC: Postpartum BTL, consent signed, will schedule for tomorrow #Circ: Yes #gTHN: Ruled in via mild range pressures in MAU, pressure range 140s/90s in the context of poorly-controlled pain without epidural. Asymptomatic. UPC ration 0.15 on 09/29/19. BP now back WNL, continue to monitor. #Polyhydramnios, Borderline LGA: Counseled patient on shoulder dystocia protocols   VMaureen Ralphs MD  12/15/2019, 2:03 AM  I personally saw and evaluated the patient, performing the key elements of the service. I developed and verified the management plan that is described in the resident's/student's note, and I agree with the content with my edits above. VSS, HRR&R, Resp unlabored,  Legs neg.  FNigel Berthold CNM 12/15/2019 7:13 AM

## 2019-12-15 NOTE — Op Note (Addendum)
CYRIL RAILEY  [birth year] - 12/15/2019  PREOPERATIVE DIAGNOSIS:  Multiparity, undesired fertility. PPD#1  POSTOPERATIVE DIAGNOSIS:  Same  PROCEDURE:  Postpartum Bilateral Tubal Sterilization using [place] (left) & [place] (right) via mini-laparotomy  SURGEONS: Aletha Halim, MD   Guillermina City, MD OB-Fellow  ANESTHESIA:  Epidural and local analgesia using 7.93% Marcaine  COMPLICATIONS:  None.  ESTIMATED BLOOD LOSS: 5 ml.   FINDINGS:  Grossly normal uterine fundus and fallopian tubes. Unable to completely visualize bilateral ovaries but glimpses of them and palpation was normal. The bilateral adnexa had minimal mobility with it worse on the left, which is why a Lawana Pai was done.   PROCEDURE DETAILS: The patient was taken to the operating room where her epidural was dosed up to surgical level and found to be adequate.  She was then placed in a supine position and prepped and draped in the usual sterile fashion.  After an adequate timeout was performed, attention was turned to the patient's abdomen where a small transverse skin incision was made under the umbilical fold, after injection of local anesthesia. The incision was taken down to the layer of fascia using the scalpel, and fascia was incised, and extended bilaterally. The peritoneum was entered in a sharp fashion. The patient was placed in Trendelenburg.    A moist lap pad was used to move omentum and bowel away until the left fallopian tube was identified and grasped with a Babcock clamp, and followed out to the fimbriated end.    The left Fallopian tube was identified, grasped with the Babcock clamps. An avascular midsection of the tube approximately 3-4cm from the cornua was grasped with the babcock clamps and brought into a knuckle. The tube was double ligated with one 0 plain gut suture and the intervening portion of tube was transected and removed.    The right Fallopian tube was then identified by tracing out to the fimbraie,  grasped with the Babcock clamps. An avascular midsection of the tube approximately 3-4cm from the cornua was grasped with the babcock clamps and the distal and proximal aspects were ligated with a suture of 2-0 vicryl, with the intervening portion of tube was transected and removed, via the Metzenbaum scissors. Another suture tie was then placed below both stumps.    Excellent hemostasis was noted from both BTL sites. The instruments were then removed from the patient's abdomen and the fascial incision was repaired with 0 Vicryl, and the skin was closed with a 4-0 Vicryl subcuticular stitch. Local analgesia was injected into the incision site. The patient tolerated the procedure well.  Sponge, lap, and needle counts were correct times two.  The patient was then taken to the recovery room awake and in stable condition.  Randa Ngo MD 12/15/2019 3:52 PM  Agree with above. I was present and scrubbed for the entire procedure.   Durene Romans MD Attending Center for [employer] [facility])

## 2019-12-16 MED ORDER — IBUPROFEN 600 MG PO TABS: 600.0000 mg | ORAL_TABLET | Freq: Four times a day (QID) | ORAL | 0 refills | Status: DC

## 2019-12-16 MED ORDER — COCONUT OIL OIL: 1.0000 "application " | TOPICAL_OIL | 0 refills | Status: DC | PRN

## 2019-12-16 MED ORDER — OXYCODONE HCL 5 MG PO TABS
5.0000 mg | ORAL_TABLET | Freq: Four times a day (QID) | ORAL | 0 refills | Status: DC | PRN
Start: 2019-12-16 — End: 2020-01-16

## 2019-12-16 NOTE — Discharge Instructions (Signed)

## 2019-12-16 NOTE — Anesthesia Postprocedure Evaluation (Signed)
Anesthesia Post Note  Patient: Victoria Trujillo  Procedure(s) Performed: POST PARTUM TUBAL LIGATION (N/A )     Patient location during evaluation: PACU Anesthesia Type: Epidural Level of consciousness: awake Pain management: pain level controlled Vital Signs Assessment: post-procedure vital signs reviewed and stable Respiratory status: spontaneous breathing Cardiovascular status: stable Postop Assessment: no headache, no backache, epidural receding, patient able to bend at knees and no apparent nausea or vomiting Anesthetic complications: no   No complications documented.  Last Vitals:  Vitals:   12/16/19 0438 12/16/19 0900  BP: 129/69 117/75  Pulse: 66 70  Resp: 16 16  Temp: 36.8 C 36.8 C  SpO2: 97% 98%    Last Pain:  Vitals:   12/16/19 1156  TempSrc:   PainSc: 6    Pain Goal: Patients Stated Pain Goal: 0 (12/15/19 0001)                 Huston Foley

## 2019-12-17 ENCOUNTER — Other Ambulatory Visit (INDEPENDENT_AMBULATORY_CARE_PROVIDER_SITE_OTHER): Payer: Self-pay

## 2019-12-17 LAB — RPR: RPR Ser Ql: NONREACTIVE — AB

## 2019-12-18 ENCOUNTER — Telehealth: Payer: Self-pay | Admitting: Lactation Services

## 2019-12-18 ENCOUNTER — Other Ambulatory Visit (INDEPENDENT_AMBULATORY_CARE_PROVIDER_SITE_OTHER): Payer: Self-pay

## 2019-12-18 ENCOUNTER — Other Ambulatory Visit (HOSPITAL_COMMUNITY): Payer: Medicaid Other

## 2019-12-18 NOTE — Telephone Encounter (Signed)
[place] to check on Prescription for Coconut Oil that was sent in on 8/21. Pharmacy indicated Coconut Oil is not filled via prescription and is OTC. Relayed message to patient via My chart.

## 2019-12-19 ENCOUNTER — Inpatient Hospital Stay (HOSPITAL_COMMUNITY)
Admission: AD | Admit: 2019-12-19 | Payer: Medicaid Other | Source: Home / Self Care | Admitting: Obstetrics and Gynecology

## 2019-12-19 ENCOUNTER — Inpatient Hospital Stay (HOSPITAL_COMMUNITY): Payer: Medicaid Other

## 2019-12-19 LAB — SURGICAL PATHOLOGY

## 2019-12-20 ENCOUNTER — Encounter: Payer: Medicaid Other | Admitting: Family Medicine

## 2020-01-02 ENCOUNTER — Encounter: Payer: Self-pay | Admitting: Lactation Services

## 2020-01-16 ENCOUNTER — Ambulatory Visit (INDEPENDENT_AMBULATORY_CARE_PROVIDER_SITE_OTHER): Payer: Medicaid Other | Admitting: Family Medicine

## 2020-01-16 ENCOUNTER — Encounter: Payer: Self-pay | Admitting: Family Medicine

## 2020-01-16 ENCOUNTER — Other Ambulatory Visit: Payer: Medicaid Other

## 2020-01-16 ENCOUNTER — Other Ambulatory Visit: Payer: Self-pay

## 2020-01-16 DIAGNOSIS — Z9851 Tubal ligation status: Secondary | ICD-10-CM

## 2020-01-16 NOTE — Progress Notes (Signed)
     [facility] Partum Visit Note  Victoria Trujillo is a 34 y.o. [phone] female who presents for a postpartum visit. She is 4 weeks postpartum following a normal spontaneous vaginal delivery.  I have fully reviewed the prenatal and intrapartum course. The delivery was at [redacted]w[redacted]d.  Anesthesia: epidural. Postpartum course has been uncomplicated. Baby is doing well. Baby is feeding by both breast and bottle - Carn[place] Good Start. Bleeding no bleeding. Bowel function is normal. Bladder function is normal. Patient is not sexually active. Contraception method is tubal ligation. Postpartum depression screening: negative.   The pregnancy intention screening data noted above was reviewed. Potential methods of contraception were discussed. The patient elected to proceed with Female Sterilization performed 12/15/19 postpartum.   The following portions of the patient's history were reviewed and updated as appropriate: allergies, current medications, past family history, past medical history, past social history, past surgical history and problem list.  Review of Systems Pertinent items noted in HPI and remainder of comprehensive ROS otherwise negative.    Objective:  Blood pressure 125/89, pulse 69, height 5\' 5"  (1.651 m), weight 227 lb 4.8 oz (103.1 kg), last menstrual period 03/21/2019, currently breastfeeding.  General:  alert, cooperative and no distress   Breasts:  not performed  Lungs: normal respiratory effort  Heart:  normal heart rate  Abdomen: soft, nontender, incision c/d/i   Vulva:  not evaluated  Vagina: not evaluated  Cervix:  not evaluated  Corpus: not examined  Adnexa:  not evaluated  Rectal Exam: Not performed.        Assessment:    Unremarkable postpartum exam. Pap smear not done at today's visit.   Plan:   Essential components of care per ACOG recommendations:  1.  Mood and well being: Patient with negative depression screening today. Reviewed local resources for support.  -  Patient does not use tobacco.  - hx of drug use? No   2. Infant care and feeding:  -Patient currently breastmilk feeding? Yes-Reviewed importance of draining breast regularly to support lactation. -Social determinants of health (SDOH) reviewed in EPIC. No concerns  3. Sexuality, contraception and birth spacing - Patient does not want a pregnancy in the next year.  Desired family size is 3 children.  - Reviewed forms of contraception in tiered fashion. Patient desired bilateral tubal ligation, completed postpartum.  4. Sleep and fatigue -Encouraged family/partner/community support of 4 hrs of uninterrupted sleep to help with mood and fatigue  5. Physical Recovery  - Discussed patients delivery - Patient had no lacerations, perineal healing reviewed. Patient expressed understanding - Patient has urinary incontinence? No - Patient is safe to resume physical and sexual activity  6.  Health Maintenance - Last pap smear done 06/08/19 and was normal with negative HPV. Mammogram not indicated given patient's age.    Arrie Senate, MD Center for [employer], [place]

## 2020-01-20 ENCOUNTER — Other Ambulatory Visit: Payer: Self-pay

## 2020-01-24 MED ORDER — IBUPROFEN 600 MG PO TABS: 600.0000 mg | ORAL_TABLET | Freq: Four times a day (QID) | ORAL | 0 refills | Status: AC

## 2020-04-03 ENCOUNTER — Encounter: Payer: Self-pay | Admitting: General Practice

## 2020-07-18 IMAGING — US US MFM OB DETAIL+14 WK
1 series · 13 of 28 positions shown · non-contrast
Comparison: none

[Series 1: us mfm ob detail+14 wk · 13 of 104 slices shown]
[im 4/104]
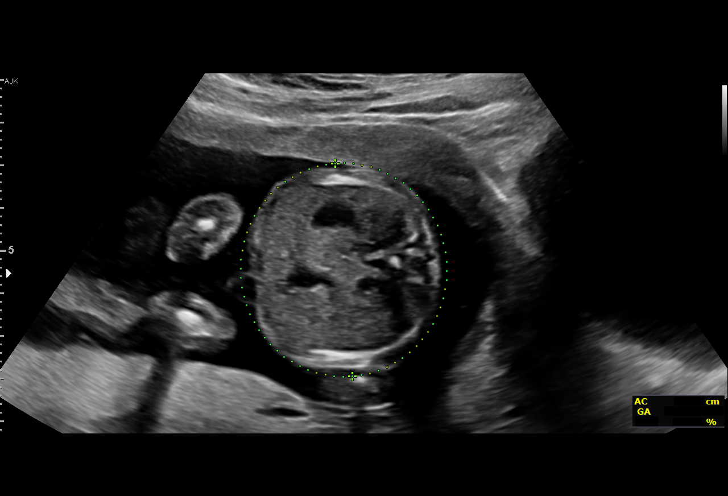
[im 12/104]
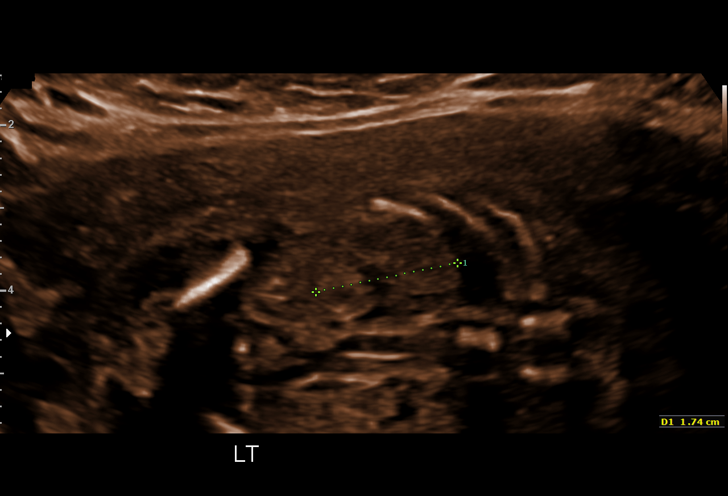
[im 20/104]
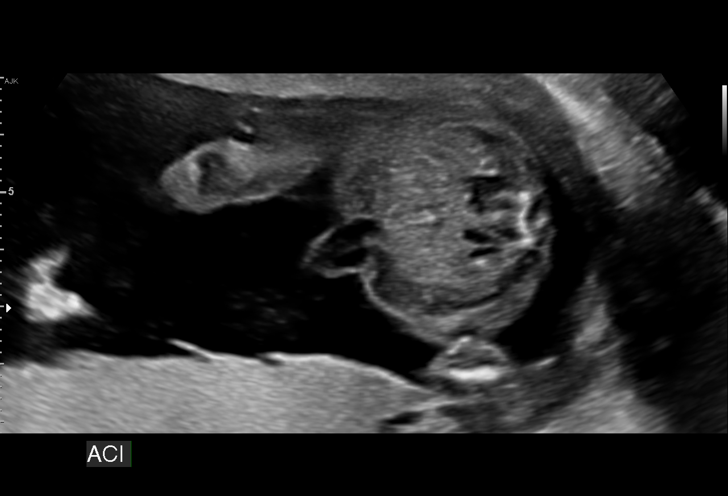
[im 27/104]
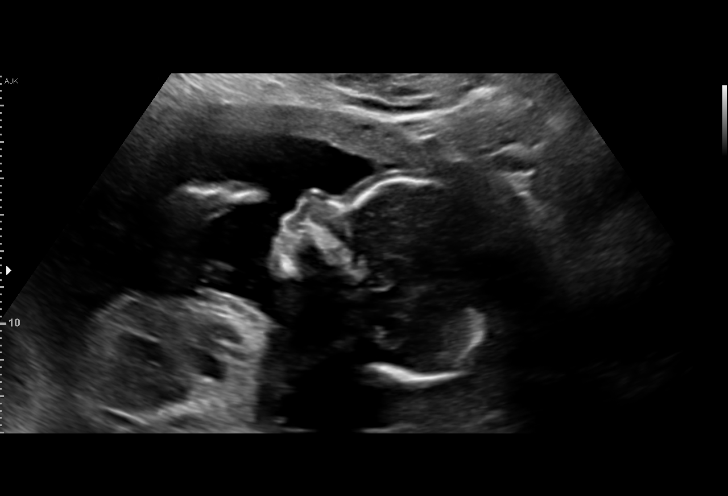
[im 35/104]
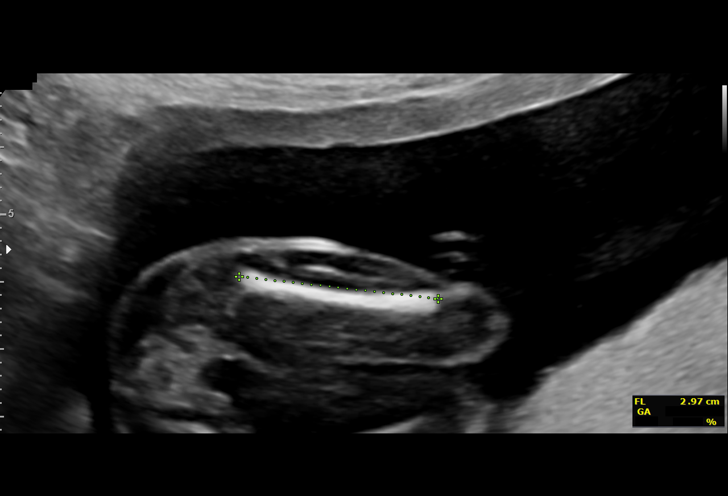
[im 42/104]
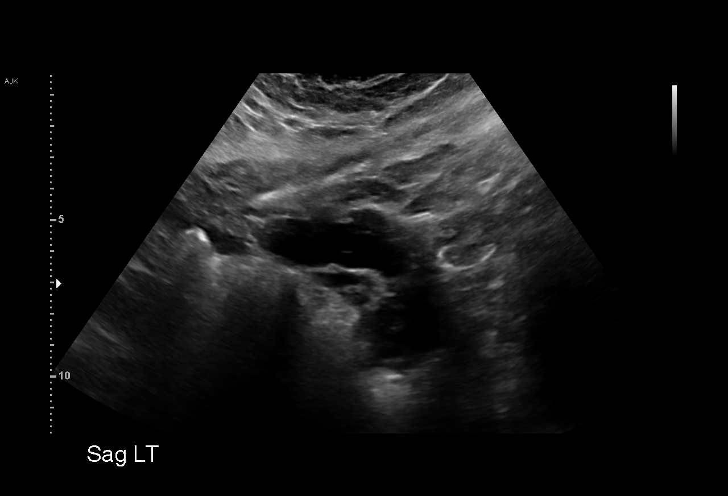
[im 54/104]
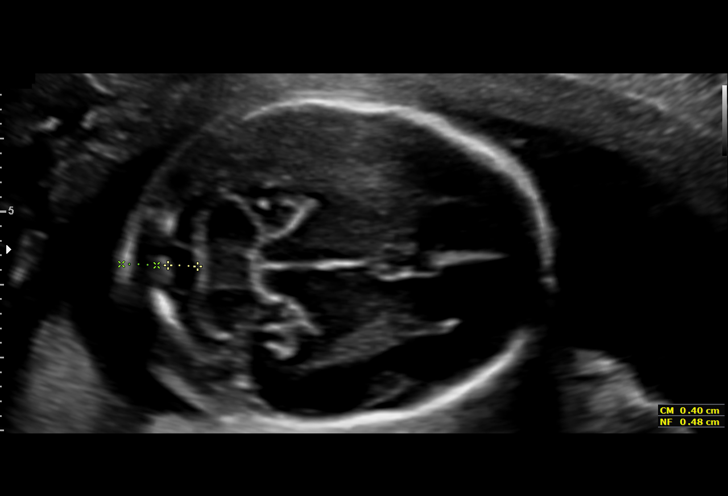
[im 62/104]
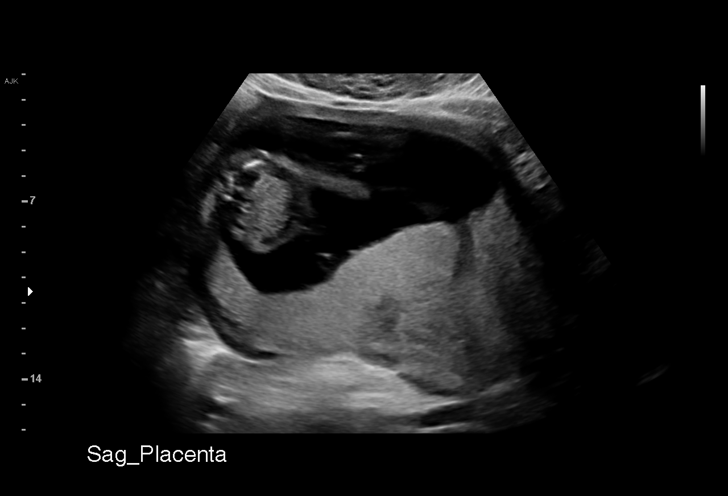
[im 69/104]
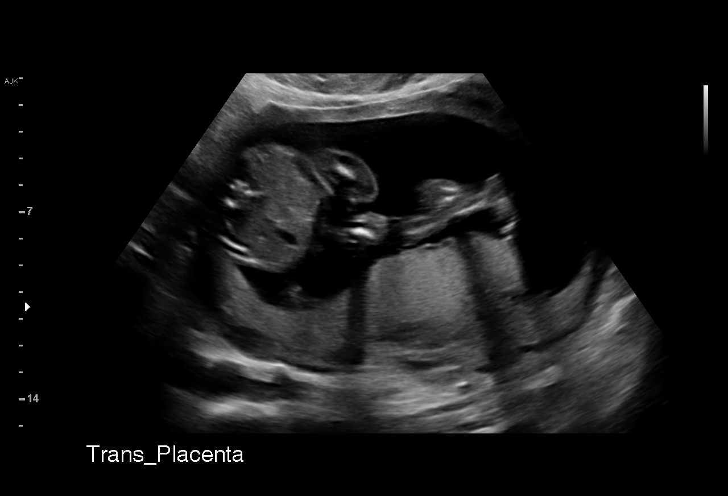
[im 77/104]
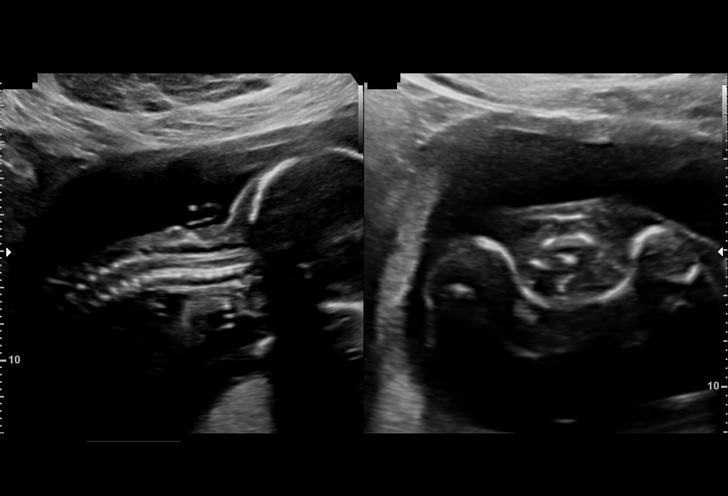
[im 84/104]
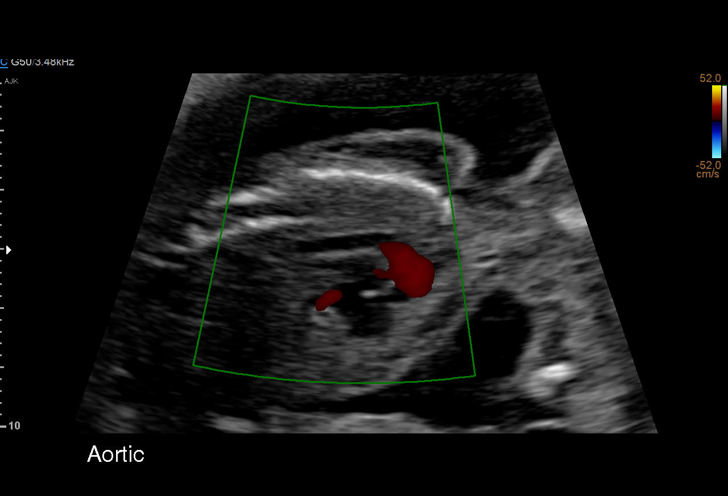
[im 92/104]
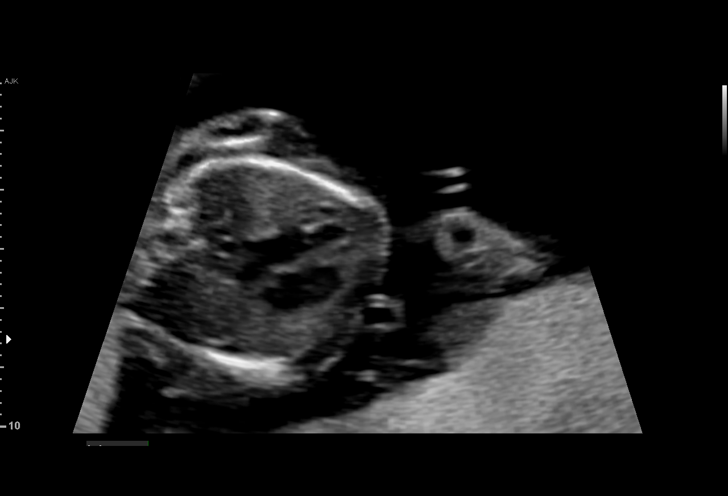
[im 100/104]
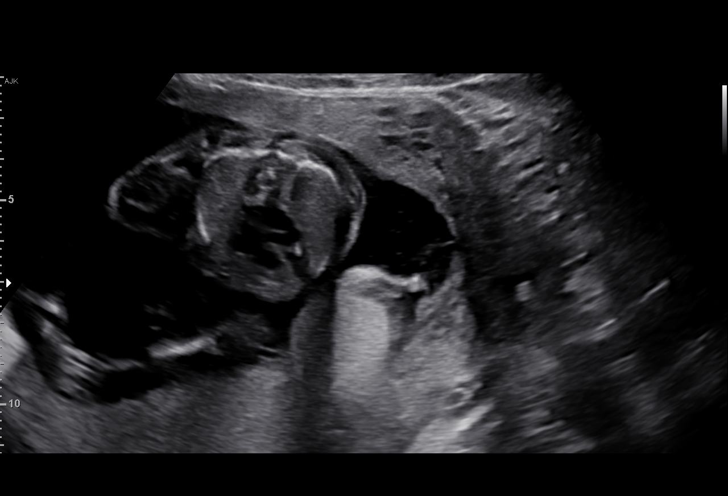

[13 of 28 positions shown; findings below may reference images not displayed]

[address]

 ----------------------------------------------------------------------

 ----------------------------------------------------------------------
Indications

  Obesity complicating pregnancy (bmi 39)
  19 weeks gestation of pregnancy
  Encounter for antenatal screening for
  malformations (low risk panorama, neg AFP,
  neg horizon)
  Poor obstetric history: Previous
  preeclampsia / eclampsia/gestational HTN
 ----------------------------------------------------------------------
Vital Signs

 BMI:
Fetal Evaluation

 Num Of Fetuses:         1
 Fetal Heart Rate(bpm):  164
 Cardiac Activity:       Observed
 Presentation:           Cephalic
 Placenta:               Posterior
 P. Cord Insertion:      Visualized, central

 Amniotic Fluid
 AFI FV:      Within normal limits
Biometry

 BPD:      44.3  mm     G. Age:  19w 3d         68  %    CI:        71.08   %    70 - 86
                                                         FL/HC:      17.9   %    16.1 -
 HC:      167.4  mm     G. Age:  19w 3d         63  %    HC/AC:      1.08        1.09 -
 AC:      154.9  mm     G. Age:  20w 5d         91  %    FL/BPD:     67.5   %
 FL:       29.9  mm     G. Age:  19w 2d         52  %    FL/AC:      19.3   %    20 - 24
 CER:      19.8  mm     G. Age:  18w 6d         47  %
 NFT:       5.4  mm
 LV:        7.8  mm
 CM:        4.2  mm

 Est. FW:     321  gm    0 lb 11 oz      92  %
OB History

 Gravidity:    5         Term:   2
 TOP:          1       Ectopic:  1
Gestational Age

 LMP:           19w 0d        Date:  03/21/19                 EDD:   12/26/19
 U/S Today:     19w 5d                                        EDD:   12/21/19
 Best:          19w 0d     Det. By:  LMP  (03/21/19)          EDD:   12/26/19
Anatomy

 Cranium:               Appears normal         Aortic Arch:            Appears normal
 Cavum:                 Appears normal         Ductal Arch:            Not well visualized
 Ventricles:            Appears normal         Diaphragm:              Appears normal
 Choroid Plexus:        Appears normal         Stomach:                Appears normal, left
                                                                       sided
 Cerebellum:            Appears normal         Abdomen:                Appears normal
 Posterior Fossa:       Appears normal         Abdominal Wall:         Appears nml (cord
                                                                       insert, abd wall)
 Nuchal Fold:           Appears normal         Cord Vessels:           Appears normal (3
                                                                       vessel cord)
 Face:                  Appears normal         Kidneys:                Appear normal
                        (orbits and profile)
 Lips:                  Appears normal         Bladder:                Appears normal
 Thoracic:              Appears normal         Spine:                  Appears normal
 Heart:                 Echogenic focus        Upper Extremities:      Appears normal
                        in LV
 RVOT:                  Not well visualized    Lower Extremities:      Appears normal
 LVOT:                  Not well visualized

 Other:  Fetus appears to be a male. Heels and LT 5th digit visualized.
         Technically difficult due to maternal habitus and fetal position. Feet
         visualized.
Cervix Uterus Adnexa

 Cervix
 Persistent CTX

 Uterus
 No abnormality visualized.

 Left Ovary
 Not visualized.

 Right Ovary
 Not visualized.

 Adnexa
 No abnormality visualized.
Comments

 This patient was seen for a detailed fetal anatomy scan due
 to maternal obesity.
 She denies any significant past medical history and denies
 any problems in her current pregnancy.
 She had a cell free DNA test earlier in her pregnancy which
 indicated a low risk for trisomy 21, 18, and 13. A male fetus is
 predicted.
 She was informed that the fetal growth and amniotic fluid
 level were appropriate for her gestational age.
 On today's exam, an intracardiac echogenic focus was noted
 in the left ventricle of the fetal heart.  The small association
 between an echogenic focus and Down syndrome was
 discussed. Due to the echogenic focus noted today, the
 patient was offered and declined an amniocentesis today for
 definitive diagnosis of fetal aneuploidy.  She reports that she
 is comfortable with her negative cell free DNA test.
 The views of the fetal anatomy were limited today due to
 maternal body habitus and the fetal position.
 The patient was informed that anomalies may be missed due
 to technical limitations. If the fetus is in a suboptimal position
 or maternal habitus is increased, visualization of the fetus in
 the maternal uterus may be impaired.
 A follow-up exam was scheduled in 4 weeks to complete the
 views of the fetal anatomy.

## 2020-08-15 IMAGING — US US MFM OB FOLLOW-UP
1 series · 14 of 28 positions shown · non-contrast
Comparison: none

[Series 1: us mfm ob follow-up · 14 of 81 slices shown]
[im 3/81]
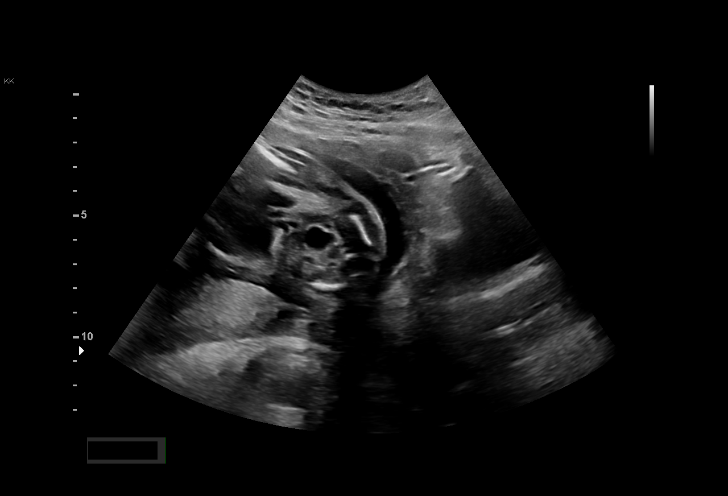
[im 9/81]
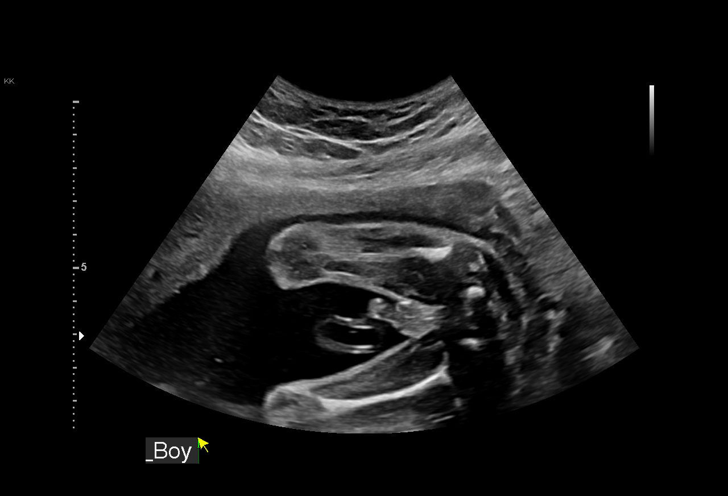
[im 15/81]
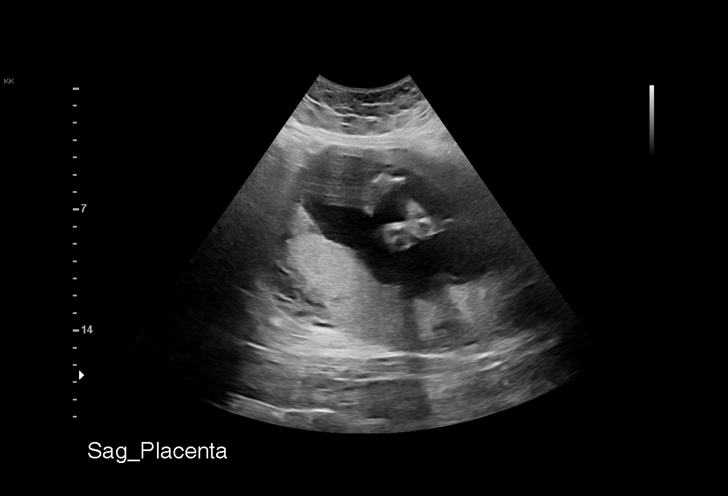
[im 21/81]
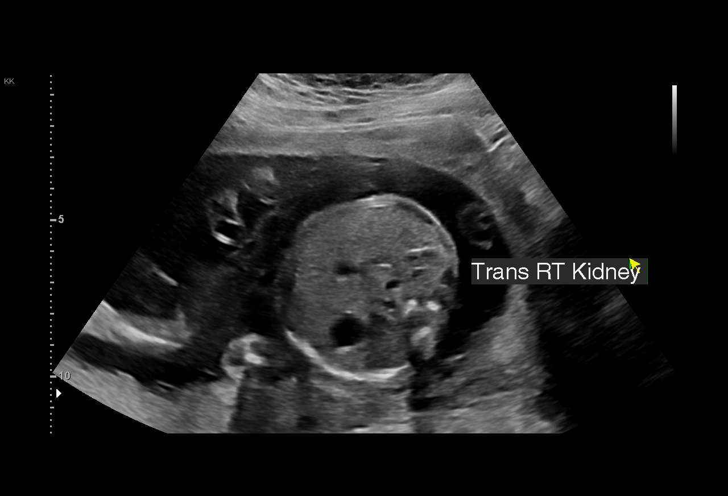
[im 27/81]
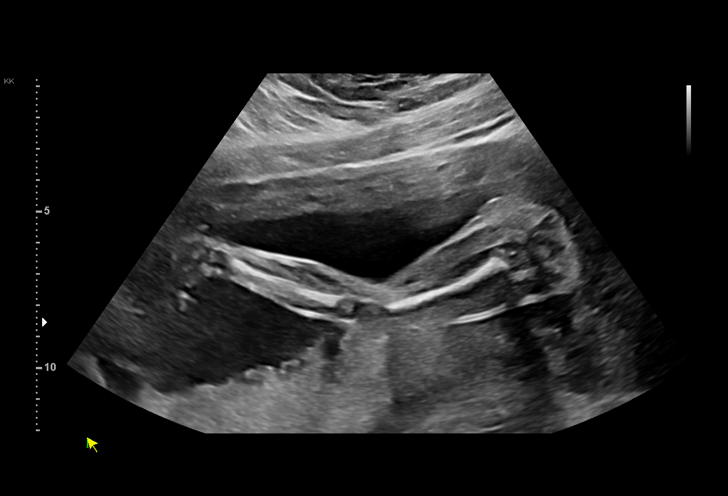
[im 33/81]
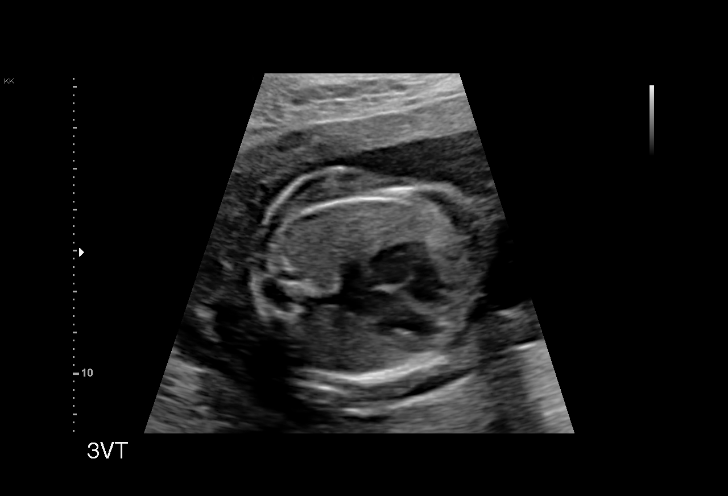
[im 39/81]
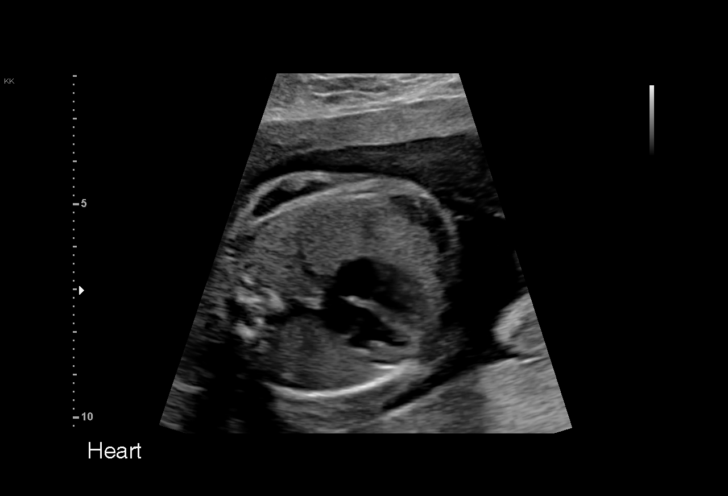
[im 45/81]
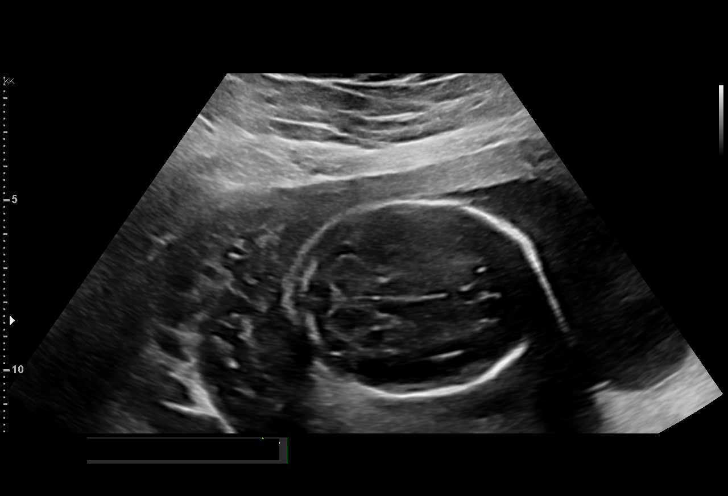
[im 51/81]
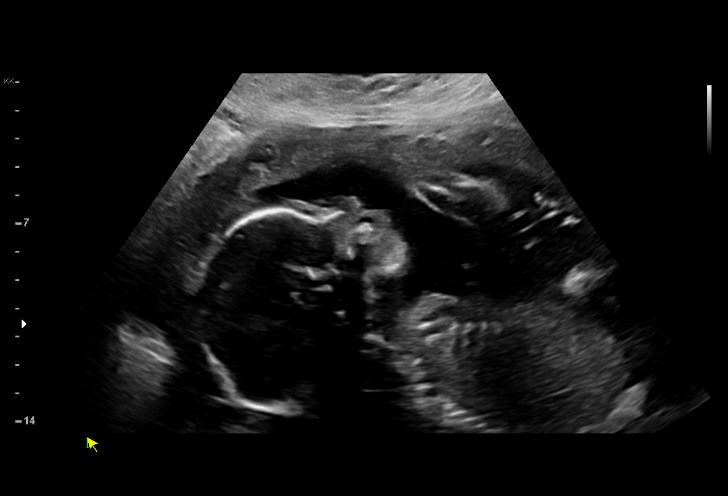
[im 57/81]
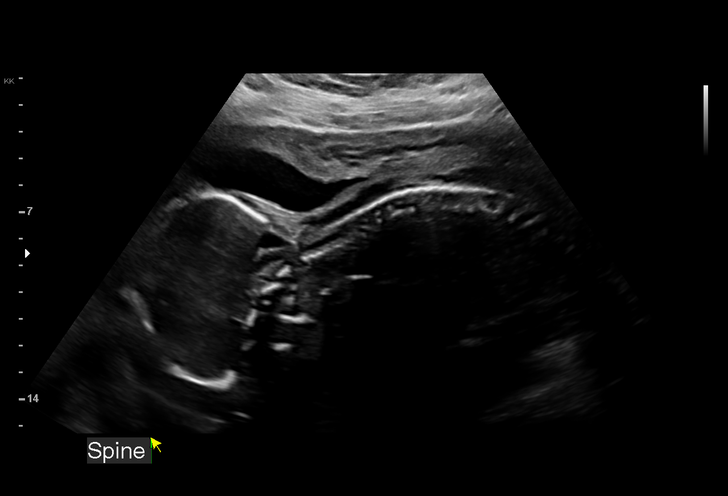
[im 63/81]
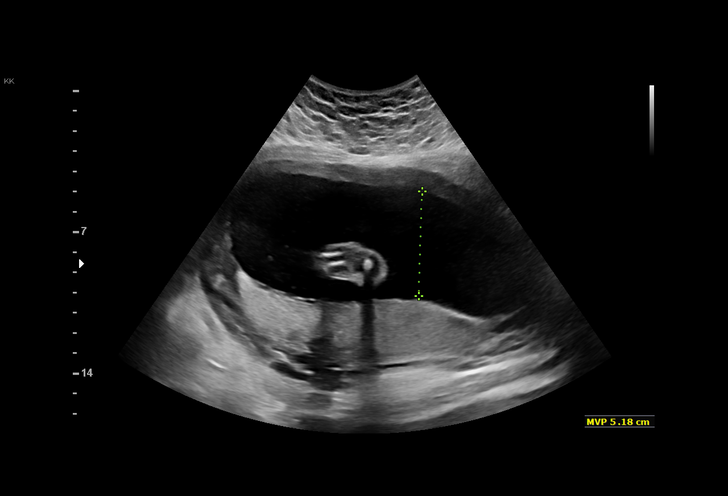
[im 69/81]
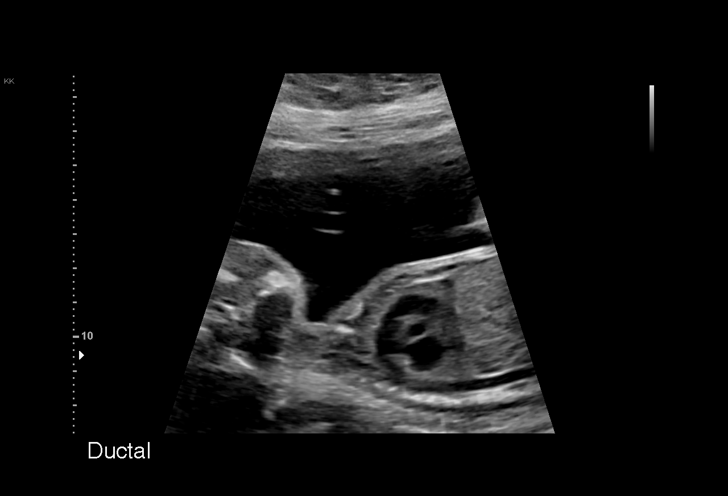
[im 75/81]
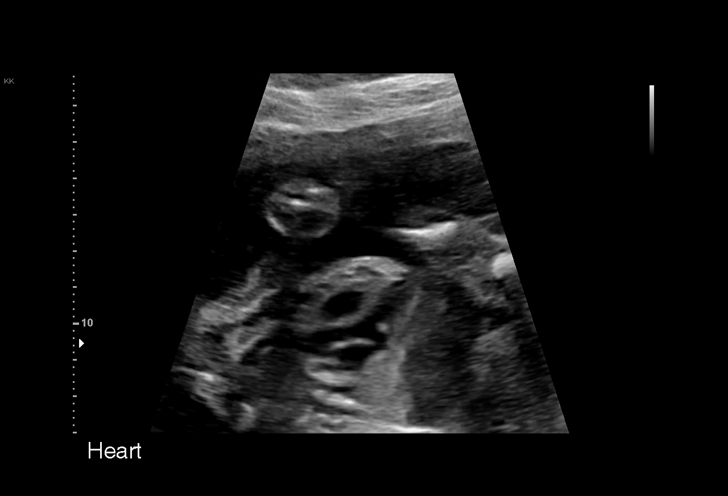
[im 81/81]
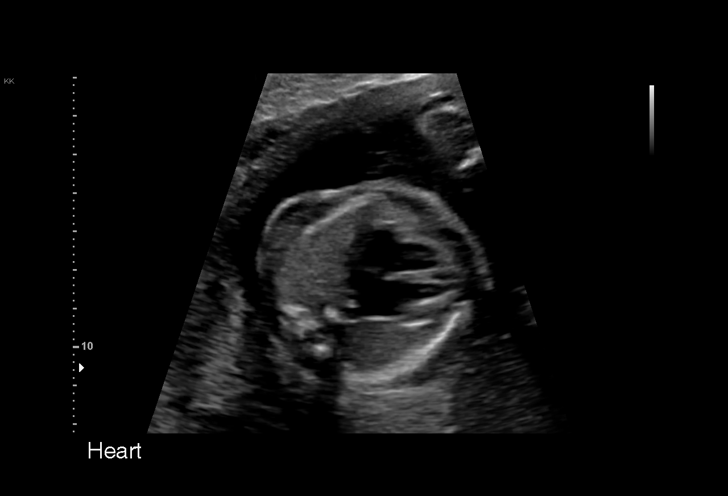

[14 of 28 positions shown; findings below may reference images not displayed]

[address]

Indications

 Obesity complicating pregnancy (bmi 39)
 Poor obstetric history: Previous
 preeclampsia / eclampsia/gestational HTN
 Negative Horizon, Negative AFP, Low Risk
 NIPS
 23 weeks gestation of pregnancy
Vital Signs

                                                Height:        5'5"
Fetal Evaluation

 Num Of Fetuses:          1
 Fetal Heart Rate(bpm):   154
 Cardiac Activity:        Observed
 Presentation:            Breech
 Placenta:                Posterior
 P. Cord Insertion:       Previously Visualized

 Amniotic Fluid
 AFI FV:      Within normal limits

                             Largest Pocket(cm)

Biometry

 BPD:      57.4  mm     G. Age:  23w 4d         67  %    CI:        69.28   %    70 - 86
                                                         FL/HC:       18.7  %    19.2 -
 HC:      220.2  mm     G. Age:  24w 0d         76  %    HC/AC:       1.14       1.05 -
 AC:       194   mm     G. Age:  24w 1d         76  %    FL/BPD:      71.8  %    71 - 87
 FL:       41.2  mm     G. Age:  23w 3d         51  %    FL/AC:       21.2  %    20 - 24

 Est. FW:     630   gm     1 lb 6 oz     81  %
OB History

 Gravidity:    5         Term:   2
 TOP:          1       Ectopic:  1
Gestational Age

 LMP:           23w 0d        Date:  03/21/19                 EDD:   12/26/19
 U/S Today:     23w 6d                                        EDD:   12/20/19
 Best:          23w 0d     Det. By:  LMP  (03/21/19)          EDD:   12/26/19
Anatomy

 Cranium:               Appears normal         Aortic Arch:            Previously seen
 Cavum:                 Previously seen        Ductal Arch:            Appears normal
 Ventricles:            Appears normal         Diaphragm:              Previously seen
 Choroid Plexus:        Previously seen        Stomach:                Appears normal, left
                                                                       sided
 Cerebellum:            Previously seen        Abdomen:                Appears normal
 Posterior Fossa:       Previously seen        Abdominal Wall:         Previously seen
 Nuchal Fold:           Previously seen        Cord Vessels:           Previously seen
 Face:                  Orbits and profile     Kidneys:                Appear normal
                        previously seen
 Lips:                  Previously seen        Bladder:                Appears normal
 Thoracic:              Appears normal         Spine:                  Previously seen
 Heart:                 Echogenic focus        Upper Extremities:      Previously seen
                        in LV
 RVOT:                  Appears normal         Lower Extremities:      Previously seen
 LVOT:                  Appears normal

 Other:  Fetus appears to be a male. Heels and LT 5th digit previously
         visualized. Technically difficult due to maternal habitus and fetal
         position.
Cervix Uterus Adnexa

 Cervix
 Length:            3.6  cm.
 Normal appearance by transabdominal scan.
Impression

 Follow up growth performed today to complete the fetal
 anatomy
 Normal inteval growth observed, with good fetal movement
 and amniotic fluid volume
 Known EIF again observed, with normal genetic and carrier
 screening.
Recommendations

 Follow up growth in 9 weeks due to elevated BMI

## 2020-11-21 IMAGING — US US MFM FETAL BPP W/O NON-STRESS
1 series · 12 of 28 positions shown · non-contrast
Comparison: none

[Series 1: us mfm fetal bpp w/o non-stress · 34 acquisitions, 12 frames shown]
[im 2/34]
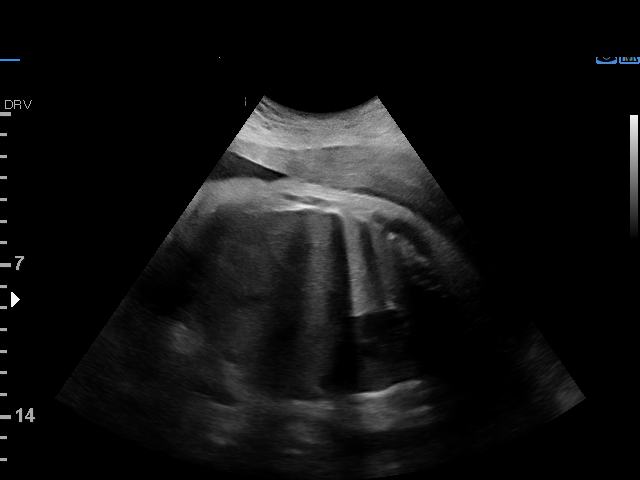
[im 4/34]
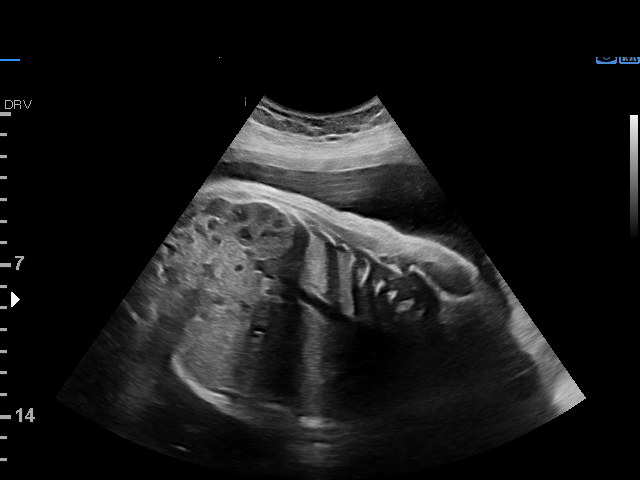
[im 7/34]
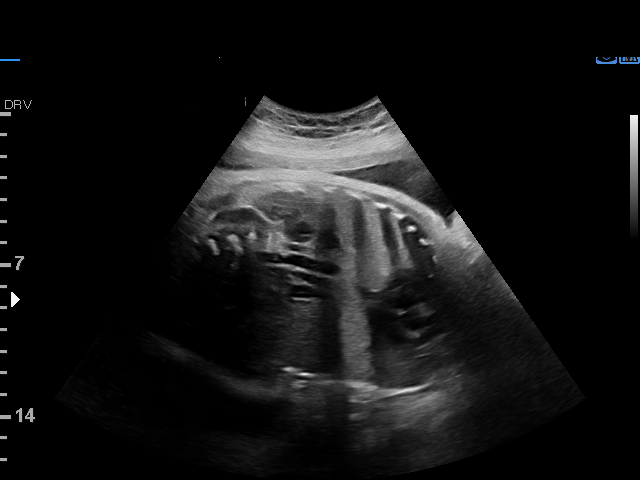
[im 10/34]
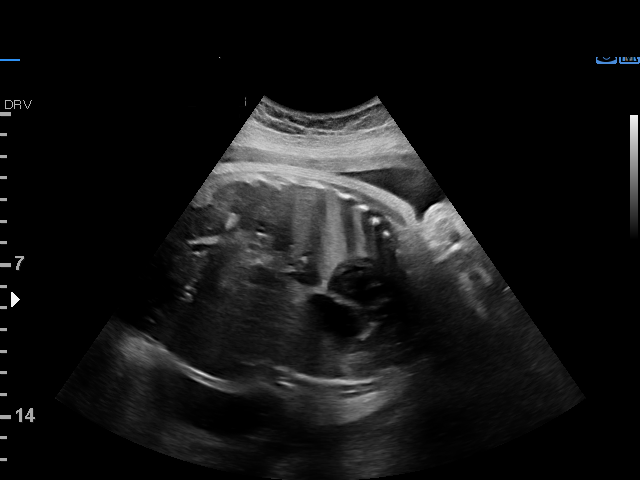
[im 13/34]
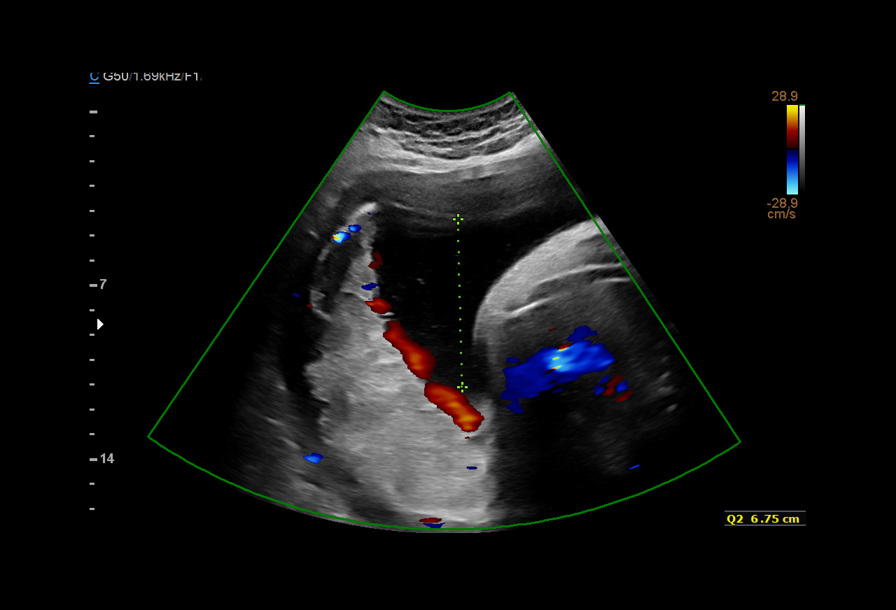
[im 15/34]
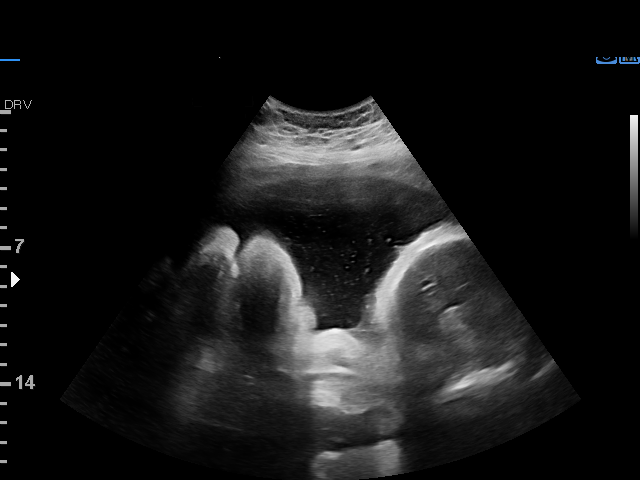
[im 19/34]
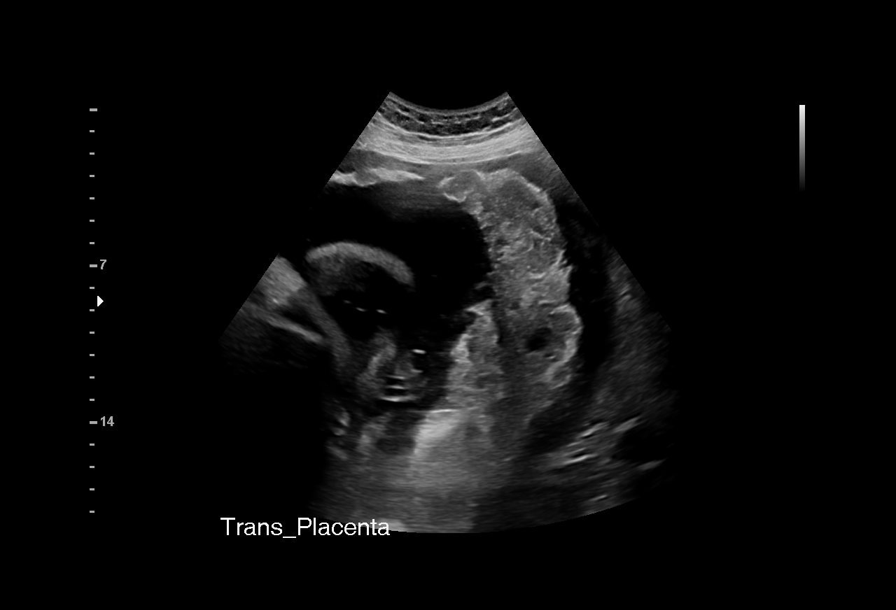
[im 21/34]
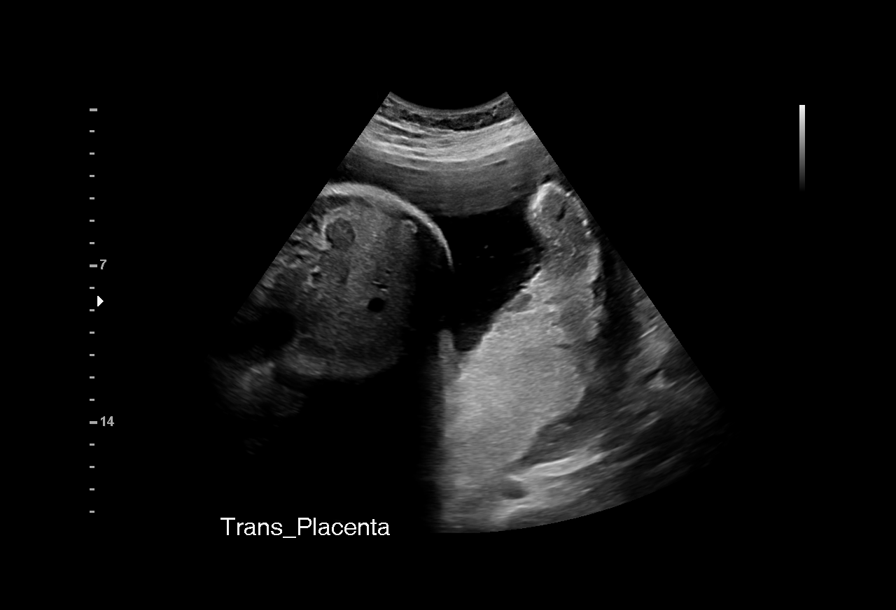
[im 24/34]
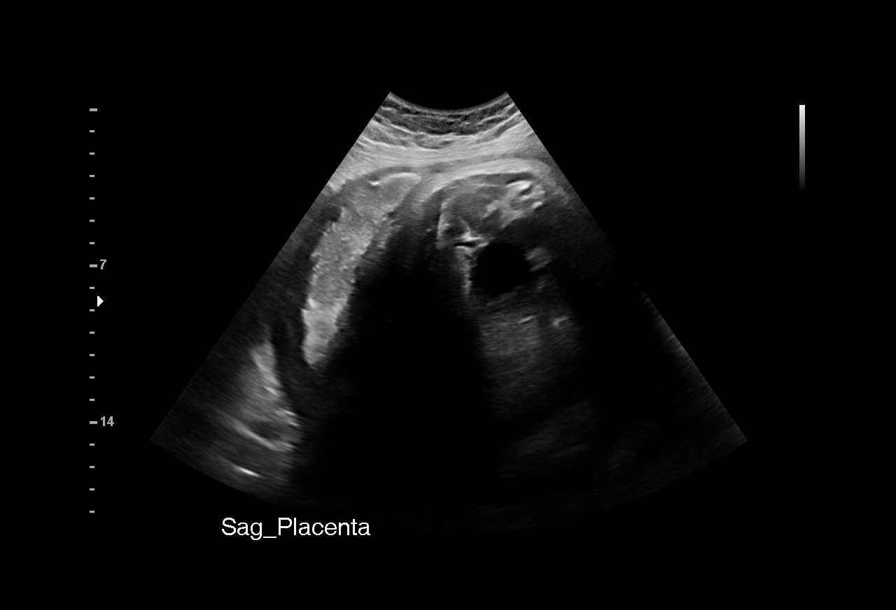
[im 27/34]
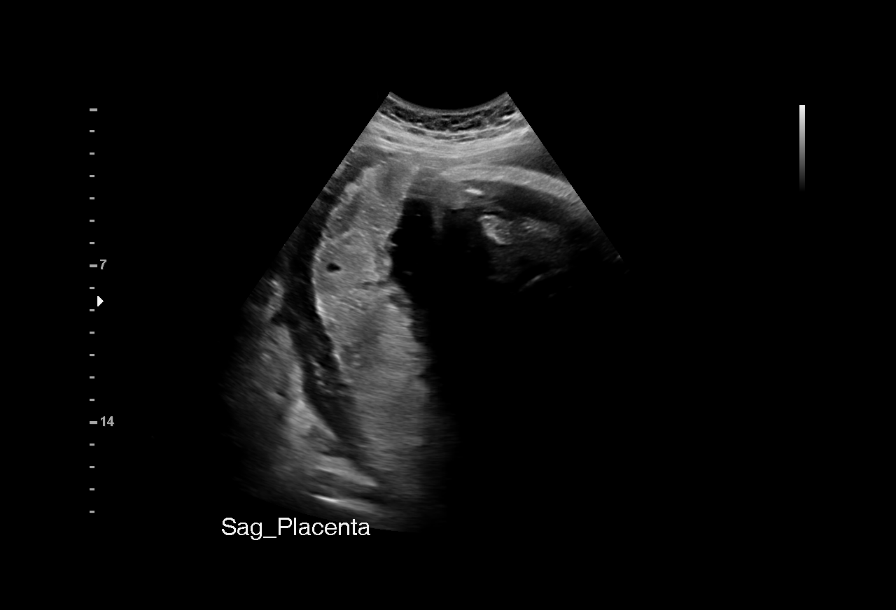
[im 30/34]
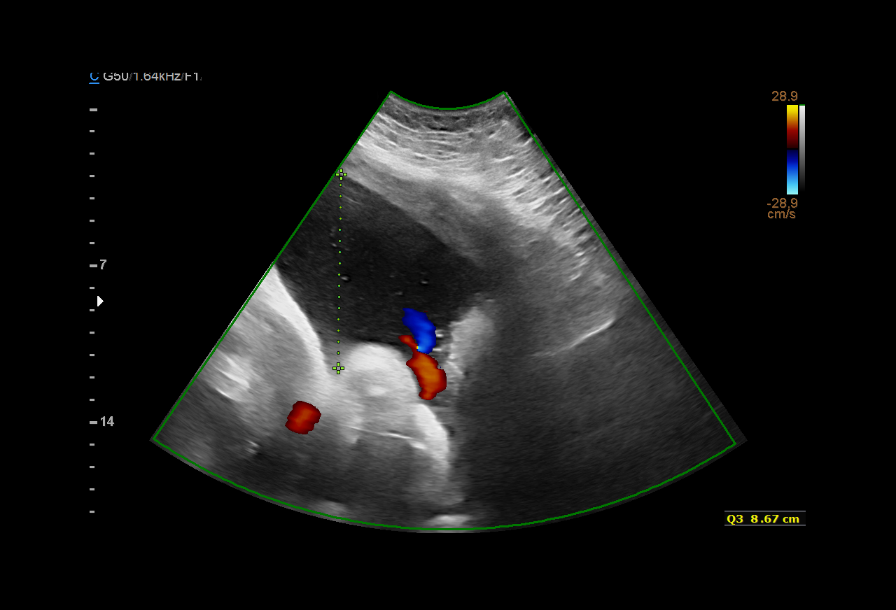
[im 32/34]
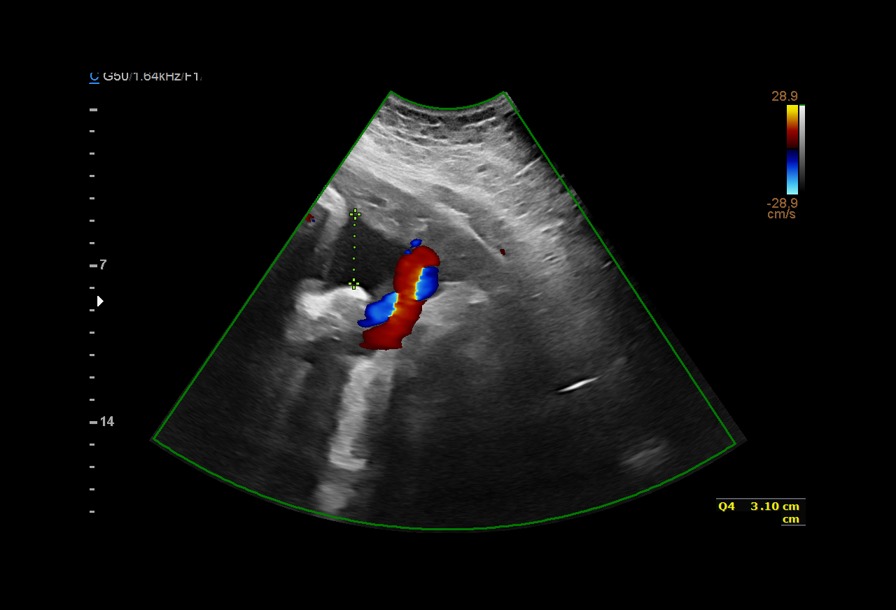

[12 of 28 positions shown; findings below may reference images not displayed]

Indications

 Polyhydramnios, third trimester, antepartum
 condition or complication, unspecified fetus
 Obesity complicating pregnancy (bmi 39)
 37 weeks gestation of pregnancy
 Poor obstetric history: Previous
 preeclampsia / eclampsia/gestational HTN
 Negative Horizon, Negative AFP, Low Risk
 NIPS
Vital Signs

                                                Height:        5'5"
Fetal Evaluation

 Num Of Fetuses:         1
 Fetal Heart Rate(bpm):  135
 Cardiac Activity:       Observed
 Presentation:           Cephalic
 Placenta:               Fundal, posterior
 P. Cord Insertion:      Previously Visualized

 Amniotic Fluid
 AFI FV:      Polyhydramnios

 AFI Sum(cm)     %Tile       Largest Pocket(cm)
 27.41           97
 RUQ(cm)       RLQ(cm)       LUQ(cm)        LLQ(cm)

Biophysical Evaluation

 Amniotic F.V:   Polyhydramnios             F. Tone:        Observed
 F. Movement:    Observed                   Score:          [DATE]
 F. Breathing:   Observed
OB History

 Gravidity:    5         Term:   2
 TOP:          1       Ectopic:  1
Gestational Age

 LMP:           37w 0d        Date:  03/21/19                 EDD:   12/26/19
 Best:          37w 0d     Det. By:  LMP  (03/21/19)          EDD:   12/26/19
Cervix Uterus Adnexa

 Cervix
 Not visualized (advanced GA >90wks)
Comments

 This patient was seen for a biophysical profile due to
 maternal obesity and polyhydramnios noted on her prior
 ultrasound exams.  She reports that she has screened
 negative for gestational diabetes in her current pregnancy.
 A biophysical profile performed today was [DATE].
 Polyhydramnios with a total AFI of between 27 to 28 cm
 continues to be noted today.
 Another biophysical profile was scheduled in 1 week.
 Due to polyhydramnios, delivery may be considered at
 around 39 weeks or greater.

## 2021-07-22 DIAGNOSIS — Z113 Encounter for screening for infections with a predominantly sexual mode of transmission: Secondary | ICD-10-CM | POA: Diagnosis not present

## 2022-11-10 ENCOUNTER — Telehealth: Payer: Medicaid Other | Admitting: Physician Assistant

## 2022-11-10 DIAGNOSIS — N9419 Other specified dyspareunia: Secondary | ICD-10-CM

## 2022-11-10 NOTE — Progress Notes (Signed)
Because of painful intercourse and bleeding with this, I feel your condition warrants further evaluation and I recommend that you be seen in a face to face visit.   NOTE: There will be NO CHARGE for this eVisit   If you are having a true medical emergency please call 911.      For an urgent face to face visit, [facility] has eight urgent care centers for your convenience:   NEW!! [facility] at [facility] Get Driving Directions [phone] [address] [place], [address]    [facility] at [facility] Get Driving Directions [phone] [address] [place], [place] [address]   [facility] Center [facility]) Get Driving Directions [phone] [address] [place], [place] [address]  [facility] Center [facility]) Get Driving Directions [phone] [address] [place],  [place]  [address]  [facility] Center [facility] - at [employer]  [phone] [address] [place],  [place] [address]   [facility] at [facility] Get Driving Directions [phone] [address] [place], [place] [address]   [facility] Urgent Care at [facility] Get Driving Directions  [phone] [address] [place], [place] [address]   [facility] Health Urgent Care at [facility] Directions [phone] [address] [place], [place] [address]  Your MyChart E-visit questionnaire answers were reviewed by a board certified advanced clinical practitioner to complete your personal care plan based on your specific symptoms.  Thank you for using e-Visits.

## 2022-11-11 ENCOUNTER — Ambulatory Visit
Admission: RE | Admit: 2022-11-11 | Discharge: 2022-11-11 | Disposition: A | Discharge status: Home / Self Care | Payer: Medicaid Other | Source: Ambulatory Visit | Attending: Internal Medicine | Admitting: Internal Medicine

## 2022-11-11 VITALS — BP 136/86 | HR 75 | Temp 98.1°F | Resp 20

## 2022-11-11 DIAGNOSIS — N939 Abnormal uterine and vaginal bleeding, unspecified: Secondary | ICD-10-CM

## 2022-11-11 DIAGNOSIS — N3001 Acute cystitis with hematuria: Secondary | ICD-10-CM | POA: Diagnosis not present

## 2022-11-11 DIAGNOSIS — Z3202 Encounter for pregnancy test, result negative: Secondary | ICD-10-CM | POA: Diagnosis not present

## 2022-11-11 DIAGNOSIS — Z113 Encounter for screening for infections with a predominantly sexual mode of transmission: Secondary | ICD-10-CM | POA: Diagnosis not present

## 2022-11-11 LAB — POCT URINALYSIS DIP (MANUAL ENTRY)
Bilirubin, UA: NEGATIVE
Glucose, UA: NEGATIVE mg/dL
Ketones, POC UA: NEGATIVE mg/dL
Nitrite, UA: NEGATIVE
Protein Ur, POC: NEGATIVE mg/dL
Spec Grav, UA: 1.015 (ref 1.010–1.025)
Urobilinogen, UA: 0.2 E.U./dL
pH, UA: 6 (ref 5.0–8.0)

## 2022-11-11 LAB — POCT URINE PREGNANCY: Preg Test, Ur: NEGATIVE

## 2022-11-11 MED ORDER — CEPHALEXIN 500 MG PO CAPS: 500.0000 mg | ORAL_CAPSULE | Freq: Two times a day (BID) | ORAL | 0 refills | Status: DC

## 2022-11-11 NOTE — ED Provider Notes (Signed)
[facility]    CSN: [id number] Arrival date & time: 11/11/22  1044      History   Chief Complaint Chief Complaint  Patient presents with   vaginal concerns    HPI SLOKA VOLANTE is a 37 y.o. female.   Patient presents today with approximately 1 week history of vaginal spotting with sexual intercourse.  Patient states that she is concerned that it could be related to latex allergy with condoms.  States that it only occurs when she uses condoms with intercourse.  States that she did have 1 episode of unprotected intercourse without a condom where it did not occur.  Reports that vaginal discharge has seemed to increase in volume but is normal in color.  She is currently on her menstrual cycle and has been having normal menstrual cycles.  Reports that she has a history of tubal ligation as well in 2021.  Reports some right lower abdominal pain that is intermittent and mild as well.  Denies fever, body aches, chills.  Denies dysuria or urinary frequency.  Reports that in 2018, she had surgery on her cervix to remove a cyst and the right lower abdominal pain feels very similar to when this occurred previously.  Denies any exposure to STD.     Past Medical History:  Diagnosis Date   Headache(784.0)    Infection    UTI   Ovarian cyst    Preeclampsia     Patient Active Problem List   Diagnosis Date Noted   History of bilateral tubal ligation 01/16/2020   Polyhydramnios affecting pregnancy 12/15/2019   Polyhydramnios affecting pregnancy in third trimester 11/29/2019   Supervision of high risk pregnancy, antepartum 05/22/2019   Obesity in pregnancy 05/22/2019    Past Surgical History:  Procedure Laterality Date   LAPAROSCOPIC OVARIAN CYSTECTOMY Right 02/02/2017   Procedure: LAPAROSCOPIC OVARIAN CYSTECTOMY;  Surgeon: Reva Bores, MD;  Location: [facility];  Service: Gynecology;  Laterality: Right;   THERAPEUTIC ABORTION     TUBAL LIGATION N/A 12/15/2019   Procedure:  POST PARTUM TUBAL LIGATION;  Surgeon: [place] Bing, MD;  Location: [facility] LD ORS;  Service: Gynecology;  Laterality: N/A;   WISDOM TOOTH EXTRACTION      OB History     Gravida  5   Para  3   Term  3   Preterm  0   AB  2   Living  3      SAB  0   IAB  1   Ectopic  1   Multiple  0   Live Births  3            Home Medications    Prior to Admission medications   Medication Sig Start Date End Date Taking? Authorizing Provider  cephALEXin (KEFLEX) 500 MG capsule Take 1 capsule (500 mg total) by mouth 2 (two) times daily for 7 days. 11/11/22 11/18/22 Yes , Acie Fredrickson, FNP  ibuprofen (ADVIL) 600 MG tablet Take 1 tablet (600 mg total) by mouth every 6 (six) hours. 01/24/20   Rolm Bookbinder, CNM  Prenatal Vit-Fe Phos-FA-Omega (VITAFOL GUMMIES) 3.33-0.333-34.8 MG CHEW Chew 1 tablet by mouth daily. 06/08/19   Anyanwu, Jethro Bastos, MD  Probiotic Product (PROBIOTIC-10 PO) Take 1 capsule by mouth daily.     [provider]    Family History Family History  Problem Relation Age of Onset   Hypertension Mother    Stroke Mother    Heart disease Mother  Diabetes Father    Hypertension Father    Heart disease Father    Stroke Father    Hypertension Maternal Grandmother    Cancer Maternal Grandmother    Kidney disease Maternal Grandfather     Social History Social History   Tobacco Use   Smoking status: Former    Types: Cigars    Quit date: 05/21/2009    Years since quitting: 13.4   Smokeless tobacco: Never   Tobacco comments:    not daily  Vaping Use   Vaping status: Never Used  Substance Use Topics   Alcohol use: Not Currently    Comment: occasional   Drug use: No     Allergies   Patient has no known allergies.   Review of Systems Review of Systems Per HPI  Physical Exam Triage Vital Signs ED Triage Vitals [11/11/22 1123]  Encounter Vitals Group     BP 136/86     Systolic BP Percentile      Diastolic BP Percentile      Pulse Rate 75      Resp 20     Temp 98.1 F (36.7 C)     Temp Source Oral     SpO2 98 %     Weight      Height      Head Circumference      Peak Flow      Pain Score 0     Pain Loc      Pain Education      Exclude from Growth Chart    No data found.  Updated Vital Signs BP 136/86 (BP Location: Right Arm)   Pulse 75   Temp 98.1 F (36.7 C) (Oral)   Resp 20   LMP 11/09/2022 (Exact Date)   SpO2 98%   Breastfeeding No   Visual Acuity Right Eye Distance:   Left Eye Distance:   Bilateral Distance:    Right Eye Near:   Left Eye Near:    Bilateral Near:     Physical Exam Constitutional:      General: She is not in acute distress.    Appearance: Normal appearance. She is not toxic-appearing or diaphoretic.  HENT:     Head: Normocephalic and atraumatic.  Eyes:     Extraocular Movements: Extraocular movements intact.     Conjunctiva/sclera: Conjunctivae normal.  Pulmonary:     Effort: Pulmonary effort is normal.  Abdominal:     General: Bowel sounds are normal. There is no distension.     Palpations: Abdomen is soft.     Tenderness: There is no abdominal tenderness.  Genitourinary:    Comments: Deferred with shared decision making.  Self swab performed. Neurological:     General: No focal deficit present.     Mental Status: She is alert and oriented to person, place, and time. Mental status is at baseline.  Psychiatric:        Mood and Affect: Mood normal.        Behavior: Behavior normal.        Thought Content: Thought content normal.        Judgment: Judgment normal.      UC Treatments / Results  Labs (all labs ordered are listed, but only abnormal results are displayed) Labs Reviewed  POCT URINALYSIS DIP (MANUAL ENTRY) - Abnormal; Notable for the following components:      Result Value   Clarity, UA cloudy (*)    Blood, UA large (*)    Leukocytes,  UA Small (1+) (*)    All other components within normal limits  URINE CULTURE  POCT URINE PREGNANCY  CERVICOVAGINAL  ANCILLARY ONLY    EKG   Radiology No results found.  Procedures Procedures (including critical care time)  Medications Ordered in UC Medications - No data to display  Initial Impression / Assessment and Plan / UC Course  I have reviewed the triage vital signs and the nursing notes.  Pertinent labs & imaging results that were available during my care of the patient were reviewed by me and considered in my medical decision making (see chart for details).     UA showing leukocytes so will opt to treat with cephalexin for possible UTI. Urine pregnancy test negative.  Cervicovaginal swab and urine culture pending.  Given exposure to STD, will await cervicovaginal swab prior to treatment.  Patient reports that symptoms typically only occur with latex condoms so suspect this is due to this.  Given patient has significant gynecological history, recommended that she see her established gynecologist for further evaluation and management. No signs of acute abdomen on exam that would necessitate emergent evaluation.  Advised strict return and ER precautions.  Patient verbalized understanding and was agreeable with plan. Final Clinical Impressions(s) / UC Diagnoses   Final diagnoses:  Acute cystitis with hematuria  Vaginal spotting  Screening examination for venereal disease  Urine pregnancy test negative     Discharge Instructions      I have prescribed you an antibiotic given concern of UTI.  Urine culture and vaginal swab are pending.  Will call if they are abnormal and send any appropriate treatment.  I recommend that you call today to schedule appointment with your gynecologist as well.     ED Prescriptions     Medication Sig Dispense Auth. Provider   cephALEXin (KEFLEX) 500 MG capsule Take 1 capsule (500 mg total) by mouth 2 (two) times daily for 7 days. 14 capsule [place], Acie Fredrickson, [place]      PDMP not reviewed this encounter.   Gustavus Bryant, [place] 11/11/22 1243

## 2022-11-11 NOTE — Discharge Instructions (Addendum)
I have prescribed you an antibiotic given concern of UTI.  Urine culture and vaginal swab are pending.  Will call if they are abnormal and send any appropriate treatment.  I recommend that you call today to schedule appointment with your gynecologist as well.

## 2022-11-11 NOTE — ED Triage Notes (Signed)
Pt reports after her sexual encounters with her partner she is having some blood afterwards x 1 week. States she is using latex condoms and not sure if she is having a reaction. Has long history of gyn problems and wonder if intercourse is causing a new problem or triggering a new one.

## 2022-11-12 ENCOUNTER — Telehealth (HOSPITAL_COMMUNITY): Payer: Self-pay | Admitting: Emergency Medicine

## 2022-11-12 LAB — CERVICOVAGINAL ANCILLARY ONLY
Bacterial Vaginitis (gardnerella): POSITIVE — AB
Candida Glabrata: NEGATIVE
Candida Vaginitis: POSITIVE — AB
Chlamydia: NEGATIVE
Comment: NEGATIVE
Comment: NEGATIVE
Comment: NEGATIVE
Comment: NEGATIVE
Comment: NEGATIVE
Comment: NORMAL
Neisseria Gonorrhea: NEGATIVE
Trichomonas: NEGATIVE

## 2022-11-12 LAB — URINE CULTURE: Culture: <10000 — AB

## 2022-11-12 MED ORDER — FLUCONAZOLE 150 MG PO TABS: 150.0000 mg | ORAL_TABLET | Freq: Once | ORAL | 0 refills | Status: AC

## 2022-11-12 MED ORDER — METRONIDAZOLE 500 MG PO TABS: 500.0000 mg | ORAL_TABLET | Freq: Two times a day (BID) | ORAL | 0 refills | Status: AC

## 2022-11-13 ENCOUNTER — Telehealth: Payer: Self-pay

## 2022-11-13 MED ORDER — CEPHALEXIN 500 MG PO CAPS: 500.0000 mg | ORAL_CAPSULE | Freq: Two times a day (BID) | ORAL | 0 refills | Status: AC

## 2022-11-13 MED ORDER — FLUCONAZOLE 150 MG PO TABS: 150.0000 mg | ORAL_TABLET | ORAL | 0 refills | Status: AC

## 2022-11-13 NOTE — Telephone Encounter (Signed)
Fluconazole 150 mg resent
